# Patient Record
Sex: Female | Born: 1965 | Race: White | Hispanic: No | Marital: Married | Smoking: Never smoker
Health system: Southern US, Community
[De-identification: ages and names within clinical notes are randomized; demographics above are authoritative.]

## PROBLEM LIST (undated history)

## (undated) DIAGNOSIS — T7840XA Allergy, unspecified, initial encounter: Secondary | ICD-10-CM

## (undated) DIAGNOSIS — I2510 Atherosclerotic heart disease of native coronary artery without angina pectoris: Secondary | ICD-10-CM

## (undated) DIAGNOSIS — Z8619 Personal history of other infectious and parasitic diseases: Secondary | ICD-10-CM

## (undated) DIAGNOSIS — K219 Gastro-esophageal reflux disease without esophagitis: Secondary | ICD-10-CM

## (undated) DIAGNOSIS — K5790 Diverticulosis of intestine, part unspecified, without perforation or abscess without bleeding: Secondary | ICD-10-CM

## (undated) DIAGNOSIS — Z973 Presence of spectacles and contact lenses: Secondary | ICD-10-CM

## (undated) DIAGNOSIS — E785 Hyperlipidemia, unspecified: Secondary | ICD-10-CM

## (undated) HISTORY — PX: BLADDER SUSPENSION: SHX72

## (undated) HISTORY — DX: Presence of spectacles and contact lenses: Z97.3

## (undated) HISTORY — DX: Allergy, unspecified, initial encounter: T78.40XA

## (undated) HISTORY — DX: Personal history of other infectious and parasitic diseases: Z86.19

## (undated) HISTORY — DX: Hyperlipidemia, unspecified: E78.5

## (undated) HISTORY — DX: Atherosclerotic heart disease of native coronary artery without angina pectoris: I25.10

## (undated) HISTORY — DX: Gastro-esophageal reflux disease without esophagitis: K21.9

## (undated) HISTORY — DX: Diverticulosis of intestine, part unspecified, without perforation or abscess without bleeding: K57.90

## (undated) HISTORY — PX: CHOLECYSTECTOMY: SHX55

## (undated) HISTORY — PX: ABDOMINAL HYSTERECTOMY: SHX81

## (undated) HISTORY — PX: LAPAROSCOPIC ENDOMETRIOSIS FULGURATION: SUR769

---

## 1998-02-08 ENCOUNTER — Other Ambulatory Visit: Admission: RE | Admit: 1998-02-08 | Discharge: 1998-02-08 | Payer: Self-pay | Admitting: Obstetrics & Gynecology

## 1998-12-05 ENCOUNTER — Other Ambulatory Visit: Admission: RE | Admit: 1998-12-05 | Discharge: 1998-12-05 | Payer: Self-pay | Admitting: Obstetrics & Gynecology

## 1999-08-11 ENCOUNTER — Inpatient Hospital Stay (HOSPITAL_COMMUNITY): Admission: AD | Admit: 1999-08-11 | Discharge: 1999-08-13 | Payer: Self-pay | Admitting: Obstetrics & Gynecology

## 1999-08-23 ENCOUNTER — Encounter: Admission: RE | Admit: 1999-08-23 | Discharge: 1999-11-21 | Payer: Self-pay | Admitting: Obstetrics & Gynecology

## 1999-09-18 ENCOUNTER — Encounter: Payer: Self-pay | Admitting: Family Medicine

## 1999-09-18 ENCOUNTER — Ambulatory Visit (HOSPITAL_COMMUNITY): Admission: RE | Admit: 1999-09-18 | Discharge: 1999-09-18 | Payer: Self-pay | Admitting: Family Medicine

## 2000-01-01 ENCOUNTER — Encounter (INDEPENDENT_AMBULATORY_CARE_PROVIDER_SITE_OTHER): Payer: Self-pay

## 2000-01-01 ENCOUNTER — Observation Stay (HOSPITAL_COMMUNITY): Admission: RE | Admit: 2000-01-01 | Discharge: 2000-01-01 | Payer: Self-pay | Admitting: Surgery

## 2000-01-01 ENCOUNTER — Encounter: Payer: Self-pay | Admitting: Surgery

## 2000-02-19 ENCOUNTER — Other Ambulatory Visit: Admission: RE | Admit: 2000-02-19 | Discharge: 2000-02-19 | Payer: Self-pay | Admitting: Obstetrics & Gynecology

## 2001-03-04 ENCOUNTER — Encounter (INDEPENDENT_AMBULATORY_CARE_PROVIDER_SITE_OTHER): Payer: Self-pay | Admitting: Specialist

## 2001-03-04 ENCOUNTER — Other Ambulatory Visit: Admission: RE | Admit: 2001-03-04 | Discharge: 2001-03-04 | Payer: Self-pay | Admitting: *Deleted

## 2001-04-30 ENCOUNTER — Other Ambulatory Visit: Admission: RE | Admit: 2001-04-30 | Discharge: 2001-04-30 | Payer: Self-pay | Admitting: Obstetrics & Gynecology

## 2002-07-21 ENCOUNTER — Other Ambulatory Visit: Admission: RE | Admit: 2002-07-21 | Discharge: 2002-07-21 | Payer: Self-pay | Admitting: Obstetrics and Gynecology

## 2003-04-26 ENCOUNTER — Observation Stay (HOSPITAL_COMMUNITY): Admission: AD | Admit: 2003-04-26 | Discharge: 2003-04-27 | Payer: Self-pay | Admitting: Obstetrics & Gynecology

## 2003-05-17 ENCOUNTER — Inpatient Hospital Stay (HOSPITAL_COMMUNITY): Admission: AD | Admit: 2003-05-17 | Discharge: 2003-05-20 | Payer: Self-pay | Admitting: Obstetrics & Gynecology

## 2003-08-03 ENCOUNTER — Other Ambulatory Visit: Admission: RE | Admit: 2003-08-03 | Discharge: 2003-08-03 | Payer: Self-pay | Admitting: Obstetrics & Gynecology

## 2004-09-06 ENCOUNTER — Other Ambulatory Visit: Admission: RE | Admit: 2004-09-06 | Discharge: 2004-09-06 | Payer: Self-pay | Admitting: Obstetrics & Gynecology

## 2005-09-14 ENCOUNTER — Other Ambulatory Visit: Admission: RE | Admit: 2005-09-14 | Discharge: 2005-09-14 | Payer: Self-pay | Admitting: Obstetrics & Gynecology

## 2007-03-11 ENCOUNTER — Ambulatory Visit (HOSPITAL_COMMUNITY): Admission: RE | Admit: 2007-03-11 | Discharge: 2007-03-12 | Payer: Self-pay | Admitting: Obstetrics and Gynecology

## 2008-09-03 ENCOUNTER — Encounter: Admission: RE | Admit: 2008-09-03 | Discharge: 2008-09-03 | Payer: Self-pay | Admitting: Family Medicine

## 2008-11-03 DIAGNOSIS — J309 Allergic rhinitis, unspecified: Secondary | ICD-10-CM | POA: Insufficient documentation

## 2008-11-03 DIAGNOSIS — F329 Major depressive disorder, single episode, unspecified: Secondary | ICD-10-CM

## 2008-11-03 DIAGNOSIS — F4321 Adjustment disorder with depressed mood: Secondary | ICD-10-CM | POA: Insufficient documentation

## 2008-11-04 ENCOUNTER — Ambulatory Visit: Payer: Self-pay | Admitting: Internal Medicine

## 2008-11-04 DIAGNOSIS — R05 Cough: Secondary | ICD-10-CM

## 2008-11-04 DIAGNOSIS — R059 Cough, unspecified: Secondary | ICD-10-CM | POA: Insufficient documentation

## 2008-11-19 ENCOUNTER — Ambulatory Visit: Payer: Self-pay | Admitting: Internal Medicine

## 2008-11-19 DIAGNOSIS — R079 Chest pain, unspecified: Secondary | ICD-10-CM | POA: Insufficient documentation

## 2008-11-22 ENCOUNTER — Telehealth (INDEPENDENT_AMBULATORY_CARE_PROVIDER_SITE_OTHER): Payer: Self-pay | Admitting: *Deleted

## 2008-12-07 ENCOUNTER — Ambulatory Visit: Payer: Self-pay | Admitting: Internal Medicine

## 2008-12-07 ENCOUNTER — Encounter: Payer: Self-pay | Admitting: Adult Health

## 2009-01-13 ENCOUNTER — Ambulatory Visit: Payer: Self-pay | Admitting: Internal Medicine

## 2009-01-18 ENCOUNTER — Ambulatory Visit: Payer: Self-pay | Admitting: Cardiology

## 2009-02-23 ENCOUNTER — Ambulatory Visit: Payer: Self-pay | Admitting: Internal Medicine

## 2009-03-24 ENCOUNTER — Encounter: Payer: Self-pay | Admitting: Internal Medicine

## 2009-08-13 DIAGNOSIS — E8940 Asymptomatic postprocedural ovarian failure: Secondary | ICD-10-CM | POA: Insufficient documentation

## 2010-08-01 ENCOUNTER — Encounter (INDEPENDENT_AMBULATORY_CARE_PROVIDER_SITE_OTHER): Payer: Self-pay | Admitting: Obstetrics and Gynecology

## 2010-08-01 ENCOUNTER — Ambulatory Visit (HOSPITAL_COMMUNITY)
Admission: RE | Admit: 2010-08-01 | Discharge: 2010-08-02 | Payer: Self-pay | Source: Home / Self Care | Attending: Obstetrics and Gynecology | Admitting: Obstetrics and Gynecology

## 2010-10-23 LAB — COMPREHENSIVE METABOLIC PANEL
ALT: 14 U/L (ref 0–35)
Albumin: 3.8 g/dL (ref 3.5–5.2)
Alkaline Phosphatase: 57 U/L (ref 39–117)
Glucose, Bld: 87 mg/dL (ref 70–99)
Potassium: 3.7 mEq/L (ref 3.5–5.1)
Sodium: 136 mEq/L (ref 135–145)
Total Protein: 7.9 g/dL (ref 6.0–8.3)

## 2010-10-23 LAB — CBC
HCT: 42.3 % (ref 36.0–46.0)
Hemoglobin: 14.6 g/dL (ref 12.0–15.0)
MCH: 30.5 pg (ref 26.0–34.0)
MCH: 30.9 pg (ref 26.0–34.0)
MCHC: 34.5 g/dL (ref 30.0–36.0)
MCV: 89.6 fL (ref 78.0–100.0)
Platelets: 242 10*3/uL (ref 150–400)
Platelets: 337 10*3/uL (ref 150–400)
RBC: 3.48 MIL/uL — ABNORMAL LOW (ref 3.87–5.11)
RBC: 4.72 MIL/uL (ref 3.87–5.11)
RDW: 12.8 % (ref 11.5–15.5)
RDW: 13.3 % (ref 11.5–15.5)
WBC: 7.9 10*3/uL (ref 4.0–10.5)
WBC: 9.4 10*3/uL (ref 4.0–10.5)

## 2010-10-23 LAB — BASIC METABOLIC PANEL
CO2: 27 mEq/L (ref 19–32)
Calcium: 8.4 mg/dL (ref 8.4–10.5)
Creatinine, Ser: 0.92 mg/dL (ref 0.4–1.2)
GFR calc Af Amer: >60 mL/min (ref >60)

## 2010-10-23 LAB — SURGICAL PCR SCREEN
MRSA, PCR: NEGATIVE
Staphylococcus aureus: NEGATIVE

## 2010-10-23 LAB — URINE MICROSCOPIC-ADD ON

## 2010-10-23 LAB — PREGNANCY, URINE: Preg Test, Ur: NEGATIVE

## 2010-10-23 LAB — URINALYSIS, ROUTINE W REFLEX MICROSCOPIC
Glucose, UA: NEGATIVE mg/dL
Specific Gravity, Urine: >1.03 — ABNORMAL HIGH (ref 1.005–1.030)

## 2010-12-26 NOTE — Op Note (Signed)
NAMEGARY, BULTMAN NO.:  [id number]   MEDICAL RECORD NO.:  [id number]          PATIENT TYPE:  AMB   LOCATION:  SDC                           FACILITY:  [facility]   PHYSICIAN:  Randye Lobo, M.D.   DATE OF BIRTH:  25-Jun-1966   DATE OF PROCEDURE:  03/11/2007  DATE OF DISCHARGE:                               OPERATIVE REPORT   PREOPERATIVE DIAGNOSIS:  1. Genuine stress incontinence.  2. Incomplete vaginal prolapse.   POSTOPERATIVE DIAGNOSIS:  1. Genuine stress incontinence.  2. Incomplete vaginal prolapse.   PROCEDURE:  Monarch transobturator sling, cystoscopy, anterior  colporrhaphy.   SURGEON:  Conley Simmonds, M.D.   ASSISTANT:  Gerrit Friends. Aldona Bar, M.D.   ANESTHESIA:  LMA, local 1% lidocaine with epinephrine 1:200,000.   IV FLUIDS:  1100 mL Ringer's lactate.   ESTIMATED BLOOD LOSS:  50 mL   URINE OUTPUT:  Quantity sufficient.   COMPLICATIONS:  None.   INDICATIONS FOR PROCEDURE:  The patient is a 45 year old para 2  Caucasian female who presented to her primary gynecologist, Dr. Annamaria Helling, with the report of urinary incontinence.  The patient was  experiencing incontinence with coughing, sneezing, laughing and exercise  which required pad use.  The patient was also reporting significant  urgency with urge incontinence.  The patient underwent multichannel  urodynamic studies on December 02, 2006 which documented mixed  incontinence.  The patient did have a Valsalva leak point pressure of 78  cm of water.  The patient began on Detrol LA for her urge incontinence.  The patient desired treatment for her stress incontinence and also  repair of the first-degree cystocele which was noted on pelvic exam.  A  plan is now made to proceed with a Monarch transobturator sling,  cystoscopy, and anterior colporrhaphy after risks, benefits, and  alternatives are reviewed.   FINDINGS:  Examination under anesthesia revealed a first-degree  cystocele and first-degree  uterine prolapse.  There was good posterior  vaginal wall support.   Cystoscopy was performed during placement of the sling procedure.  There  is no evidence of foreign body in the bladder or the urethra.  The  bladder was visualized throughout 360 degrees including the bladder dome  and trigone.  Initially, only the right ureter demonstrated patency  after injection of indigo carmine dye.  There was some delay on the left-  hand side.  The suture had been placed over some bleeding vessels on the  bladder during the course of the dissection for the anterior  colporrhaphy.  When there was a delay in demonstration of indigo carmine  from the left ureter, a decision was made to remove this suture, which  actually had been placed very superficially.  There was then evidence of  indigo carmine dye coming from the left ureter after this suture was  removed.   PROCEDURE:  The patient was reidentified in the preoperative hold area.  She received Ciprofloxacin 400 mg IV for antibiotic prophylaxis.  She  received both TED hose and PAS stockings for DVT prophylaxis.   In the operating room, the patient received  an LMA anesthetic.  She was  placed in the dorsal lithotomy position.  The vagina and the lower  abdomen were sterilely prepped and draped.  A Foley catheter was placed  in the bladder.   An exam under anesthesia was performed.  A weighted speculum was placed  inside the vagina.  Allis clamps were placed along the anterior vaginal  wall.  The vaginal mucosa was injected with half percent lidocaine with  1:200,000 of epinephrine.  The anterior vaginal wall mucosa was then  incised vertically with a scalpel from the level of just above the  cervix to 1 cm below the urethral meatus.  A combination of sharp and  blunt dissection were used to dissect the vaginal mucosa away from the  underlying subvaginal tissue and bladder.  The dissection was carried  back to the level of the pubic rami  cranially and down to the level of  the cervix caudally.  Hemostasis during the procedure was performed with  a combination of monopolar cautery.  In the midline underneath the  urethral area.  A figure-of-eight suture of 2-0 Vicryl was placed for  hemostasis.  Similarly two sutures of 0 Vicryl were placed in a figure-  of-eight fashion over the bleeding vessel on the bladder on the  patient's left-hand side.   The Monarch sling was next placed.  The adductor longus tendons were  identified in the thighs bilaterally.  Crural incisions were then  created bilaterally approximately 2 cm below this and lateral to the  pubic rami.  These measured each approximately 0.75 cm.  The Monarch was  placed first on the patient's left-hand side.  The needle passer was  brought through the skin, through the obturator internus muscle and  membrane and out through the vagina.  This was performed without  difficulty.  The same procedure that was performed on the left-hand side  was then repeated on the right-hand side.   A Foley catheter was removed and cystoscopy was performed and the  findings are as noted above.  The cystoscope was removed and the bladder  was drained and the Foley catheter was replaced.  The sling was then  connected to the needle passers and the sling was adjusted under the mid  urethra.  While a Kelly clamp was placed between the sling and the  urethra.  The plastic sheaths were removed.  Excess sling material was  then trimmed.  The Foley catheter was removed and again, cystoscopy was  performed.  Again there was no evidence of patency of the left ureter.  The bladder was therefore drained of the cystoscopy fluid and one of the  sutures on the left side of the bladder was then cut.  Hemostasis was  still good in this area.  Cystoscopy was performed again and now there  was patency of the left ureter.  The bladder was again drained of  cystoscopy fluid and the Foley catheter was  replaced.   The anterior colporrhaphy was performed with vertical mattress sutures  of 2-0 Vicryl.  Excess vaginal mucosa was trimmed and the anterior  vaginal wall was then closed with a running locked suture of 2-0 Vicryl.  Hemostasis was good.   The suprapubic incisions were closed with Dermabond.  The Foley catheter  was left to gravity drainage.  The patient was cleansed of Betadine.  This concluded the patient's procedure.  There were no complications.  All needle, instrument, and sponge counts were correct.  The patient is  escorted to the recovery room in stable and awake condition.      Randye Lobo, M.D.  Electronically Signed     BES/MEDQ  D:  03/11/2007  T:  03/11/2007  Job:  [id number]

## 2010-12-29 NOTE — Op Note (Signed)
[place]  Patient:    [name], [name]                     MRN: [id number] Proc. Date: 01/01/00 Adm. Date:  [id number] Disc. Date: [id number] Attending:  Andre Lefort CC:         Elana Alm. Eliezer Lofts., M.D.             Gerrit Friends. Aldona Bar, M.D.                           Operative Report  CCS# [id number]  DATE OF BIRTH:  20-Jul-1966  PREOPERATIVE DIAGNOSIS:  Chronic cholecystitis with cholelithiasis.  POSTOPERATIVE DIAGNOSIS:  Chronic cholecystitis with cholelithiasis.  OPERATION PERFORMED:  Laparoscopic cholecystectomy with intraoperative cholangiogram.  SURGEON:  Sandria Bales. Ezzard Standing, M.D.  ASSISTANT:  Angelia Mould. Derrell Lolling, M.D.  ANESTHESIA:  General endotracheal.  ESTIMATED BLOOD LOSS:  Minimal.  INDICATIONS FOR PROCEDURE:  The patient is a 45 year old white female who presents with symptomatic cholelithiasis.  She now comes for laparoscopic cholecystectomy.  The indications and complications of the procedure had been explained to the patient.  DESCRIPTION OF PROCEDURE:  The patient was placed in the supine position, given a general endotracheal anesthetic.  She had 1 gm of Ancef at the initiation of the procedure, PAS stockings in place and orogastric tube in place.  Her abdomen was prepped with Betadine solution and sterilely draped.  An infraumbilical incision was made with sharp dissection carried down to the abdominal cavity.  A 0 degree laparoscope was inserted through a 12 mm Hasson trocar and this was secured with a 0 Vicryl suture.  Laparoscopic exploration revealed both lobes of the liver unremarkable.  Anterior wall of the stomach unremarkable.  The bowel was unremarkable.  There was no evidence of hernia. The bladder was fairly full.  Three additional trocars were placed.  A 10 mm Ethicon trocar in the subxiphoid location and two 5 mm Ethicon trocars in the right midsubcostal, right lateral subcostal locations.  Each trocar  site had been infiltrated with 0.25% Marcaine prior to insertion.  The gallbladder was grasped, rotated cephalad.  There were noted to be adhesions about halfway up the gallbladder wall with part of the duodenum adhesed to the gallbladder consistent with chronic cholecystitis.  These adhesions were taken down in a blunt fashion.  The cystic duct was then isolated.  An intraoperative cholangiogram was obtained using a cut off taut catheter inserted through a Jelco catheter into the abdominal cavity.  The cystic duct was cut and milked back.  There was no stone or debris coming back.  The catheter was then placed in the cystic duct and clamped and intraoperative cholangiogram under fluoroscopy using about 10 cc of half-strength Hypaque solution was then shot.  This showed free flow of contrast down the cystic duct into the common bile duct.  The common bile duct was fairly short but had prompt flow into the duodenum.  There was no obstruction and no mass and had free flow back into the hepatic radicals.  This was felt to be a normal intraoperative cholangiogram and the taut catheter was then removed.  The cystic duct was then triply endoclipped and divided.  There was no one discrete cystic artery.  There were multiple tiny branches with I either clipped or bovied.  The gallbladder was then bluntly and sharply dissected from the gallbladder bed using primarily  hook Bovie coagulation. Prior to complete division of the gallbladder from the gallbladder bed, the gallbladder bed itself was visualized.  There was no bile leak, no bleeding and the triangle of Calot was visualized.  There was no bile leak or bleeding.  The gallbladder was then divided and delivered through the umbilicus intact and sent to pathology.  Each trocar was then visualized.  There was no bleeding at any trocar site.  The umbilical trocar was closed with a 0 Vicryl suture.  The other trocars were then removed.  The skin  closed at each site with a 5-0 Vicryl suture, painted with tincture of benzoin, steri-stripped and sterilely dressed.  The patient tolerated the procedure well, was transported to the recovery room in good condition.  The sponge and needle counts were correct at the end of the case. DD:  01/01/00 TD:  01/04/00 Job: [id number]

## 2010-12-29 NOTE — Discharge Summary (Signed)
   NAME:  [name], [name]                        ACCOUNT NO.:  [id number]   MEDICAL RECORD NO.:  [id number]                   PATIENT TYPE:  INP   LOCATION:  9160                                 FACILITY:  [facility]   PHYSICIAN:  Miguel Aschoff, M.D.                    DATE OF BIRTH:  Aug 07, 1966   DATE OF ADMISSION:  04/26/2003  DATE OF DISCHARGE:  04/27/2003                                 DISCHARGE SUMMARY   ADMISSION DIAGNOSES:  1. Intrauterine pregnancy at 36 weeks.  2. Viral syndrome.   FINAL DIAGNOSES:  1. Intrauterine pregnancy at 36 weeks.  2. Viral syndrome.   BRIEF HISTORY:  The patient is a 45 year old white female gravida 2, para 1-  0-0-1 at 36 weeks who presented to the office with nausea and vomiting not  responsive to outpatient therapy.  Because of her symptomatology, it was  elected to admit the patient for IV hydration and observation.  Additionally, since the patient did not have a Strep culture done she was  started on clindamycin therapy ____ mg IV q.8h.  The patient responded very  readily to the intravenous fluids.  Her fetal heart tracing remained  reactive.  She was noted to be having rare contractions.  However, by the  afternoon of September 14 she was in satisfactory condition and felt stable  enough to be sent home.  She was sent home on Zofran 8 mg early dissolving  tablets, Imodium as needed for diarrhea.  She is instructed to remain on  clear liquids until she felt better, to return to the office in one week for  a follow-up examination.                                               Miguel Aschoff, M.D.    AR/MEDQ  D:  06/16/2003  T:  06/17/2003  Job:  [id number]

## 2011-03-13 ENCOUNTER — Other Ambulatory Visit: Payer: Self-pay | Admitting: Family Medicine

## 2011-03-13 DIAGNOSIS — M25562 Pain in left knee: Secondary | ICD-10-CM

## 2011-03-14 ENCOUNTER — Ambulatory Visit
Admission: RE | Admit: 2011-03-14 | Discharge: 2011-03-14 | Disposition: A | Discharge status: Home / Self Care | Payer: BLUE CROSS/BLUE SHIELD | Source: Ambulatory Visit | Attending: Family Medicine | Admitting: Family Medicine

## 2011-03-14 DIAGNOSIS — M25562 Pain in left knee: Secondary | ICD-10-CM

## 2011-05-28 LAB — CBC
MCHC: 34.6
MCV: 93
Platelets: 267
RBC: 4.73
RDW: 12.6
WBC: 6

## 2011-05-28 LAB — BASIC METABOLIC PANEL
Chloride: 103
Creatinine, Ser: 0.91
GFR calc Af Amer: >60
GFR calc non Af Amer: >60

## 2011-05-28 LAB — URINALYSIS, ROUTINE W REFLEX MICROSCOPIC
Glucose, UA: NEGATIVE
Ketones, ur: NEGATIVE
Leukocytes, UA: NEGATIVE
Nitrite: NEGATIVE
Specific Gravity, Urine: >1.03 — ABNORMAL HIGH
pH: 6

## 2011-05-28 LAB — URINE MICROSCOPIC-ADD ON

## 2011-05-28 LAB — PREGNANCY, URINE: Preg Test, Ur: NEGATIVE

## 2014-08-09 ENCOUNTER — Other Ambulatory Visit: Payer: Self-pay | Admitting: Family Medicine

## 2014-08-09 DIAGNOSIS — E049 Nontoxic goiter, unspecified: Secondary | ICD-10-CM

## 2014-08-11 ENCOUNTER — Ambulatory Visit
Admission: RE | Admit: 2014-08-11 | Discharge: 2014-08-11 | Disposition: A | Discharge status: Home / Self Care | Payer: BC Managed Care – PPO | Source: Ambulatory Visit | Attending: Family Medicine | Admitting: Family Medicine

## 2014-08-11 DIAGNOSIS — E049 Nontoxic goiter, unspecified: Secondary | ICD-10-CM

## 2014-08-12 ENCOUNTER — Other Ambulatory Visit: Payer: Self-pay | Admitting: Family Medicine

## 2014-08-12 DIAGNOSIS — R1310 Dysphagia, unspecified: Secondary | ICD-10-CM

## 2014-08-31 ENCOUNTER — Ambulatory Visit
Admission: RE | Admit: 2014-08-31 | Discharge: 2014-08-31 | Disposition: A | Discharge status: Home / Self Care | Payer: Self-pay | Source: Ambulatory Visit | Attending: Family Medicine | Admitting: Family Medicine

## 2014-08-31 DIAGNOSIS — R1310 Dysphagia, unspecified: Secondary | ICD-10-CM

## 2016-07-25 ENCOUNTER — Other Ambulatory Visit: Payer: Self-pay | Admitting: Family Medicine

## 2016-07-25 ENCOUNTER — Other Ambulatory Visit: Payer: BLUE CROSS/BLUE SHIELD

## 2016-07-25 ENCOUNTER — Ambulatory Visit
Admission: RE | Admit: 2016-07-25 | Discharge: 2016-07-25 | Disposition: A | Discharge status: Home / Self Care | Payer: BLUE CROSS/BLUE SHIELD | Source: Ambulatory Visit | Attending: Family Medicine | Admitting: Family Medicine

## 2016-07-25 DIAGNOSIS — R10A0 Flank pain, unspecified side: Secondary | ICD-10-CM

## 2016-07-25 DIAGNOSIS — R109 Unspecified abdominal pain: Secondary | ICD-10-CM

## 2016-07-27 ENCOUNTER — Other Ambulatory Visit: Payer: Self-pay | Admitting: Family Medicine

## 2016-07-27 DIAGNOSIS — R160 Hepatomegaly, not elsewhere classified: Secondary | ICD-10-CM

## 2016-08-04 ENCOUNTER — Ambulatory Visit
Admission: RE | Admit: 2016-08-04 | Discharge: 2016-08-04 | Disposition: A | Discharge status: Home / Self Care | Payer: BLUE CROSS/BLUE SHIELD | Source: Ambulatory Visit | Attending: Family Medicine | Admitting: Family Medicine

## 2016-08-04 ENCOUNTER — Other Ambulatory Visit: Payer: Self-pay | Admitting: Family Medicine

## 2016-08-04 DIAGNOSIS — R160 Hepatomegaly, not elsewhere classified: Secondary | ICD-10-CM

## 2016-08-17 ENCOUNTER — Other Ambulatory Visit: Payer: BLUE CROSS/BLUE SHIELD

## 2016-11-21 ENCOUNTER — Ambulatory Visit
Admission: RE | Admit: 2016-11-21 | Discharge: 2016-11-21 | Disposition: A | Discharge status: Home / Self Care | Payer: BLUE CROSS/BLUE SHIELD | Source: Ambulatory Visit | Attending: Family Medicine | Admitting: Family Medicine

## 2016-11-21 ENCOUNTER — Other Ambulatory Visit: Payer: Self-pay | Admitting: Family Medicine

## 2016-11-21 DIAGNOSIS — R160 Hepatomegaly, not elsewhere classified: Secondary | ICD-10-CM

## 2016-11-21 MED ORDER — GADOBENATE DIMEGLUMINE 529 MG/ML IV SOLN
7.0000 mL | Freq: Once | INTRAVENOUS | Status: AC | PRN
  Administered 2016-11-21: 7 mL via INTRAVENOUS

## 2017-07-31 ENCOUNTER — Other Ambulatory Visit: Payer: Self-pay | Admitting: Obstetrics & Gynecology

## 2017-07-31 DIAGNOSIS — R928 Other abnormal and inconclusive findings on diagnostic imaging of breast: Secondary | ICD-10-CM

## 2017-08-05 ENCOUNTER — Ambulatory Visit
Admission: RE | Admit: 2017-08-05 | Discharge: 2017-08-05 | Disposition: A | Discharge status: Home / Self Care | Payer: BLUE CROSS/BLUE SHIELD | Source: Ambulatory Visit | Attending: Obstetrics & Gynecology | Admitting: Obstetrics & Gynecology

## 2017-08-05 ENCOUNTER — Other Ambulatory Visit: Payer: Self-pay | Admitting: Obstetrics & Gynecology

## 2017-08-05 DIAGNOSIS — R928 Other abnormal and inconclusive findings on diagnostic imaging of breast: Secondary | ICD-10-CM

## 2017-08-05 DIAGNOSIS — N6310 Unspecified lump in the right breast, unspecified quadrant: Secondary | ICD-10-CM

## 2018-02-03 ENCOUNTER — Ambulatory Visit
Admission: RE | Admit: 2018-02-03 | Discharge: 2018-02-03 | Disposition: A | Discharge status: Home / Self Care | Payer: Self-pay | Source: Ambulatory Visit | Attending: Obstetrics & Gynecology | Admitting: Obstetrics & Gynecology

## 2018-02-03 ENCOUNTER — Other Ambulatory Visit: Payer: Self-pay | Admitting: Obstetrics & Gynecology

## 2018-02-03 DIAGNOSIS — N63 Unspecified lump in unspecified breast: Secondary | ICD-10-CM

## 2018-02-03 DIAGNOSIS — N6310 Unspecified lump in the right breast, unspecified quadrant: Secondary | ICD-10-CM

## 2018-07-17 DIAGNOSIS — S6992XA Unspecified injury of left wrist, hand and finger(s), initial encounter: Secondary | ICD-10-CM | POA: Diagnosis present

## 2018-07-17 DIAGNOSIS — Y929 Unspecified place or not applicable: Secondary | ICD-10-CM | POA: Diagnosis not present

## 2018-07-17 DIAGNOSIS — T23222A Burn of second degree of single left finger (nail) except thumb, initial encounter: Secondary | ICD-10-CM | POA: Insufficient documentation

## 2018-07-17 DIAGNOSIS — Y999 Unspecified external cause status: Secondary | ICD-10-CM | POA: Insufficient documentation

## 2018-07-17 DIAGNOSIS — X19XXXA Contact with other heat and hot substances, initial encounter: Secondary | ICD-10-CM | POA: Diagnosis not present

## 2018-07-17 DIAGNOSIS — Y939 Activity, unspecified: Secondary | ICD-10-CM | POA: Insufficient documentation

## 2018-07-18 ENCOUNTER — Emergency Department (HOSPITAL_COMMUNITY)
Admission: EM | Admit: 2018-07-18 | Discharge: 2018-07-18 | Disposition: A | Discharge status: Home / Self Care | Payer: BLUE CROSS/BLUE SHIELD | Attending: Emergency Medicine | Admitting: Emergency Medicine

## 2018-07-18 ENCOUNTER — Other Ambulatory Visit: Payer: Self-pay

## 2018-07-18 DIAGNOSIS — T23222A Burn of second degree of single left finger (nail) except thumb, initial encounter: Secondary | ICD-10-CM

## 2018-07-18 MED ORDER — LIDOCAINE HCL (PF) 1 % IJ SOLN
5.0000 mL | Freq: Once | INTRAMUSCULAR | Status: AC
  Administered 2018-07-18: 5 mL via INTRADERMAL
  Filled 2018-07-18: qty 5
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
## 2018-07-18 NOTE — Discharge Instructions (Addendum)
1. Medications: Silvadene - prescription at home, usual home medications 2. Treatment: rest, drink plenty of fluids, keep wound clean with warm soap and water, keep bandages dry 3. Follow Up: Please followup with your primary doctor in 3-5 days for discussion of your diagnoses and further evaluation after today's visit; if you do not have a primary care doctor use the resource guide provided to find one; Please return to the ER for infection including redness, increased pain, purulent drainage

## 2018-07-31 ENCOUNTER — Encounter: Payer: Self-pay | Admitting: Internal Medicine

## 2018-08-07 ENCOUNTER — Other Ambulatory Visit: Payer: BLUE CROSS/BLUE SHIELD

## 2018-09-22 ENCOUNTER — Emergency Department (HOSPITAL_BASED_OUTPATIENT_CLINIC_OR_DEPARTMENT_OTHER)
Admission: EM | Admit: 2018-09-22 | Discharge: 2018-09-22 | Disposition: A | Discharge status: Home / Self Care | Payer: BLUE CROSS/BLUE SHIELD | Attending: Emergency Medicine | Admitting: Emergency Medicine

## 2018-09-22 ENCOUNTER — Encounter (HOSPITAL_BASED_OUTPATIENT_CLINIC_OR_DEPARTMENT_OTHER): Payer: Self-pay | Admitting: *Deleted

## 2018-09-22 ENCOUNTER — Other Ambulatory Visit: Payer: Self-pay

## 2018-09-22 ENCOUNTER — Emergency Department (HOSPITAL_BASED_OUTPATIENT_CLINIC_OR_DEPARTMENT_OTHER): Payer: BLUE CROSS/BLUE SHIELD

## 2018-09-22 DIAGNOSIS — K5792 Diverticulitis of intestine, part unspecified, without perforation or abscess without bleeding: Secondary | ICD-10-CM | POA: Insufficient documentation

## 2018-09-22 DIAGNOSIS — R1030 Lower abdominal pain, unspecified: Secondary | ICD-10-CM | POA: Diagnosis present

## 2018-09-22 DIAGNOSIS — R109 Unspecified abdominal pain: Secondary | ICD-10-CM

## 2018-09-22 LAB — URINALYSIS, ROUTINE W REFLEX MICROSCOPIC
Bilirubin Urine: NEGATIVE
Glucose, UA: NEGATIVE mg/dL
KETONES UR: NEGATIVE mg/dL
Leukocytes, UA: NEGATIVE
Nitrite: NEGATIVE
Protein, ur: NEGATIVE mg/dL
Specific Gravity, Urine: <1.005 — ABNORMAL LOW (ref 1.005–1.030)
pH: 6 (ref 5.0–8.0)

## 2018-09-22 LAB — COMPREHENSIVE METABOLIC PANEL
ALK PHOS: 68 U/L (ref 38–126)
ALT: 17 U/L (ref 0–44)
AST: 19 U/L (ref 15–41)
Albumin: 4.4 g/dL (ref 3.5–5.0)
Anion gap: 7 (ref 5–15)
BILIRUBIN TOTAL: 1.4 mg/dL — AB (ref 0.3–1.2)
BUN: 15 mg/dL (ref 6–20)
CALCIUM: 9.6 mg/dL (ref 8.9–10.3)
CO2: 27 mmol/L (ref 22–32)
CREATININE: 0.84 mg/dL (ref 0.44–1.00)
Chloride: 103 mmol/L (ref 98–111)
GFR calc non Af Amer: >60 mL/min (ref >60)
GLUCOSE: 90 mg/dL (ref 70–99)
Potassium: 3.5 mmol/L (ref 3.5–5.1)
Sodium: 137 mmol/L (ref 135–145)
Total Protein: 8.1 g/dL (ref 6.5–8.1)

## 2018-09-22 LAB — CBC
HCT: 46 % (ref 36.0–46.0)
Hemoglobin: 15.1 g/dL — ABNORMAL HIGH (ref 12.0–15.0)
MCH: 31 pg (ref 26.0–34.0)
MCHC: 32.8 g/dL (ref 30.0–36.0)
MCV: 94.5 fL (ref 80.0–100.0)
NRBC: 0 % (ref 0.0–0.2)
PLATELETS: 346 10*3/uL (ref 150–400)
RBC: 4.87 MIL/uL (ref 3.87–5.11)
RDW: 12.8 % (ref 11.5–15.5)
WBC: 10.5 10*3/uL (ref 4.0–10.5)

## 2018-09-22 LAB — URINALYSIS, MICROSCOPIC (REFLEX)

## 2018-09-22 LAB — LIPASE, BLOOD: Lipase: 29 U/L (ref 11–51)

## 2018-09-22 MED ORDER — ONDANSETRON HCL 4 MG/2ML IJ SOLN
4.0000 mg | Freq: Once | INTRAMUSCULAR | Status: AC
  Administered 2018-09-22: 4 mg via INTRAVENOUS
  Filled 2018-09-22: qty 2

## 2018-09-22 MED ORDER — CEPHALEXIN 500 MG PO CAPS: 500.0000 mg | ORAL_CAPSULE | Freq: Three times a day (TID) | ORAL | 0 refills | Status: AC

## 2018-09-22 MED ORDER — SODIUM CHLORIDE 0.9 % IV BOLUS
500.0000 mL | Freq: Once | INTRAVENOUS | Status: AC
  Administered 2018-09-22: 500 mL via INTRAVENOUS

## 2018-09-22 MED ORDER — IOPAMIDOL (ISOVUE-300) INJECTION 61%
100.0000 mL | Freq: Once | INTRAVENOUS | Status: AC | PRN
  Administered 2018-09-22: 100 mL via INTRAVENOUS

## 2018-09-22 MED ORDER — METRONIDAZOLE 500 MG PO TABS: 500.0000 mg | ORAL_TABLET | Freq: Three times a day (TID) | ORAL | 0 refills | Status: AC

## 2018-09-22 MED ORDER — SODIUM CHLORIDE 0.9% FLUSH
3.0000 mL | Freq: Once | INTRAVENOUS | Status: DC
  Filled 2018-09-22: qty 3

## 2018-09-22 MED ORDER — ONDANSETRON 4 MG PO TBDP: 4.0000 mg | ORAL_TABLET | Freq: Three times a day (TID) | ORAL | 0 refills | Status: DC | PRN

## 2018-09-22 MED ORDER — TRAMADOL HCL 50 MG PO TABS: 50.0000 mg | ORAL_TABLET | Freq: Four times a day (QID) | ORAL | 0 refills | Status: DC | PRN

## 2018-09-22 MED ORDER — FENTANYL CITRATE (PF) 100 MCG/2ML IJ SOLN
50.0000 ug | Freq: Once | INTRAMUSCULAR | Status: AC
  Administered 2018-09-22: 50 ug via INTRAVENOUS
  Filled 2018-09-22: qty 2

## 2018-09-22 NOTE — ED Provider Notes (Signed)
[facility] EMERGENCY DEPARTMENT Provider Note   CSN: [id number] Arrival date & time: 09/22/18  1259     History   Chief Complaint Chief Complaint  Patient presents with  . Abdominal Pain    HPI [name] is a 53 y.o. female.  HPI Patient presents with has had some mild pains with it over lower abdominal pain.  The last month.  However much more severe today.  Cramping.  States it was too severe to go to work.  No fevers or chills.  Has had nausea.  No diarrhea or constipation.  States she feels as if she has been urinating little more frequency.  States her abdomen is been feeling larger also.  Has had a previous total hysterectomy.   Patient Active Problem List   Diagnosis Date Noted  . CHEST PAIN 11/19/2008  . COUGH 11/04/2008  . DEPRESSION 11/03/2008  . ALLERGIC RHINITIS 11/03/2008    Past Surgical History:  Procedure Laterality Date  . ABDOMINAL HYSTERECTOMY    . BLADDER SUSPENSION    . CHOLECYSTECTOMY       OB History   No obstetric history on file.      Home Medications    Prior to Admission medications   Medication Sig Start Date End Date Taking? Authorizing Provider  ESTRADIOL PO Take by mouth.   Yes [provider]    Family History No family history on file.  Social History Social History   Tobacco Use  . Smoking status: Never Smoker  . Smokeless tobacco: Never Used  Substance Use Topics  . Alcohol use: Not Currently  . Drug use: Never     Allergies   Ciprofloxacin; Penicillins; and Sulfonamide derivatives   Review of Systems Review of Systems  Constitutional: Positive for appetite change.  HENT: Negative for congestion.   Respiratory: Negative for shortness of breath.   Gastrointestinal: Positive for abdominal distention, abdominal pain and nausea. Negative for constipation and vomiting.  Genitourinary: Positive for frequency. Negative for flank pain.  Musculoskeletal: Negative for back pain.  Skin:  Negative for rash.  Neurological: Negative for weakness.  Psychiatric/Behavioral: Negative for confusion.     Physical Exam Updated Vital Signs BP 132/90 (BP Location: Left Arm)   Pulse (!) 110   Temp 97.9 F (36.6 C) (Oral)   Resp 18   Ht 5\' 9"  (1.753 m)   Wt 74.4 kg   SpO2 100%   BMI 24.22 kg/m   Physical Exam HENT:     Head: Normocephalic.  Cardiovascular:     Rate and Rhythm: Regular rhythm.  Pulmonary:     Breath sounds: No wheezing, rhonchi or rales.  Abdominal:     Hernia: No hernia is present.     Comments: Lower abdominal tenderness without hernia or mass.  No CVA tenderness.  Skin:    General: Skin is warm.  Neurological:     Mental Status: She is alert and oriented to person, place, and time.      ED Treatments / Results  Labs (all labs ordered are listed, but only abnormal results are displayed) Labs Reviewed  COMPREHENSIVE METABOLIC PANEL - Abnormal; Notable for the following components:      Result Value   Total Bilirubin 1.4 (*)    All other components within normal limits  CBC - Abnormal; Notable for the following components:   Hemoglobin 15.1 (*)    All other components within normal limits  URINALYSIS, ROUTINE W REFLEX MICROSCOPIC - Abnormal; Notable for  the following components:   Color, Urine STRAW (*)    Specific Gravity, Urine <1.005 (*)    Hgb urine dipstick TRACE (*)    All other components within normal limits  URINALYSIS, MICROSCOPIC (REFLEX) - Abnormal; Notable for the following components:   Bacteria, UA FEW (*)    All other components within normal limits  LIPASE, BLOOD    EKG None  Radiology No results found.  Procedures Procedures (including critical care time)  Medications Ordered in ED Medications  sodium chloride flush (NS) 0.9 % injection 3 mL (3 mLs Intravenous Not Given 09/22/18 1321)  iopamidol (ISOVUE-300) 61 % injection 100 mL (has no administration in time range)  sodium chloride 0.9 % bolus 500 mL (500  mLs Intravenous New Bag/Given 09/22/18 1323)  ondansetron (ZOFRAN) injection 4 mg (4 mg Intravenous Given 09/22/18 1326)  fentaNYL (SUBLIMAZE) injection 50 mcg (50 mcg Intravenous Given 09/22/18 1325)     Initial Impression / Assessment and Plan / ED Course  I have reviewed the triage vital signs and the nursing notes.  Pertinent labs & imaging results that were available during my care of the patient were reviewed by me and considered in my medical decision making (see chart for details).     Patient with lower abdominal pain.  Lab work overall reassuring.  CT scan pending.  Care turned over to Dr. Billy Fischer.  Final Clinical Impressions(s) / ED Diagnoses   Final diagnoses:  None    ED Discharge Orders    None       Davonna Belling, MD 09/22/18 [id number]

## 2018-09-22 NOTE — ED Provider Notes (Signed)
  Physical Exam  BP (!) 130/95 (BP Location: Right Arm)   Pulse 100   Temp 97.9 F (36.6 C) (Oral)   Resp 14   Ht 5\' 9"  (1.753 m)   Wt 74.4 kg   SpO2 100%   BMI 24.22 kg/m   Physical Exam  ED Course/Procedures     Procedures  MDM  Received care form Dr. Alvino Chapel.  CT shows uncomplicated diverticulitis.  Discussed care. Given prescription for abx-keflex and flagyl. (allergic to cipro, penicillins).  Reviewed in [place] drug database with no prescriptions. Given rx for tramadol, zofran, recommend ibuprofen and tylenol. Patient discharged in stable condition with understanding of reasons to return.      Gareth Morgan, MD 09/23/18 1152

## 2018-09-22 NOTE — ED Triage Notes (Signed)
Lower abdominal pain x 1 month. Pain is stabbing. She is guarding her abdomen. She looks bloated.

## 2018-09-22 NOTE — ED Notes (Signed)
Pt states she still has pain and pressure in her abdomen. Pt to RR unassisted

## 2018-11-13 ENCOUNTER — Other Ambulatory Visit: Payer: BLUE CROSS/BLUE SHIELD

## 2019-02-02 ENCOUNTER — Ambulatory Visit
Admission: RE | Admit: 2019-02-02 | Discharge: 2019-02-02 | Disposition: A | Discharge status: Home / Self Care | Payer: PRIVATE HEALTH INSURANCE | Source: Ambulatory Visit | Attending: Obstetrics & Gynecology | Admitting: Obstetrics & Gynecology

## 2019-02-02 ENCOUNTER — Other Ambulatory Visit: Payer: Self-pay

## 2019-02-02 DIAGNOSIS — N63 Unspecified lump in unspecified breast: Secondary | ICD-10-CM

## 2019-07-07 ENCOUNTER — Other Ambulatory Visit: Payer: Self-pay

## 2019-07-07 ENCOUNTER — Encounter: Payer: Self-pay | Admitting: Physician Assistant

## 2019-07-07 ENCOUNTER — Ambulatory Visit (INDEPENDENT_AMBULATORY_CARE_PROVIDER_SITE_OTHER): Payer: BC Managed Care – PPO | Admitting: Physician Assistant

## 2019-07-07 VITALS — BP 110/78 | HR 97 | Temp 98.6°F | Resp 16 | Ht 68.0 in | Wt 173.0 lb

## 2019-07-07 DIAGNOSIS — R0789 Other chest pain: Secondary | ICD-10-CM

## 2019-07-07 DIAGNOSIS — E8940 Asymptomatic postprocedural ovarian failure: Secondary | ICD-10-CM

## 2019-07-07 DIAGNOSIS — R5382 Chronic fatigue, unspecified: Secondary | ICD-10-CM | POA: Diagnosis not present

## 2019-07-07 DIAGNOSIS — E7800 Pure hypercholesterolemia, unspecified: Secondary | ICD-10-CM

## 2019-07-07 DIAGNOSIS — F329 Major depressive disorder, single episode, unspecified: Secondary | ICD-10-CM | POA: Insufficient documentation

## 2019-07-07 DIAGNOSIS — B009 Herpesviral infection, unspecified: Secondary | ICD-10-CM | POA: Insufficient documentation

## 2019-07-07 DIAGNOSIS — N3281 Overactive bladder: Secondary | ICD-10-CM | POA: Insufficient documentation

## 2019-07-07 DIAGNOSIS — J309 Allergic rhinitis, unspecified: Secondary | ICD-10-CM

## 2019-07-07 DIAGNOSIS — E663 Overweight: Secondary | ICD-10-CM | POA: Insufficient documentation

## 2019-07-07 DIAGNOSIS — K219 Gastro-esophageal reflux disease without esophagitis: Secondary | ICD-10-CM | POA: Insufficient documentation

## 2019-07-07 DIAGNOSIS — F419 Anxiety disorder, unspecified: Secondary | ICD-10-CM | POA: Insufficient documentation

## 2019-07-07 MED ORDER — MELOXICAM 15 MG PO TABS: 15.0000 mg | ORAL_TABLET | Freq: Every day | ORAL | 0 refills | Status: DC

## 2019-07-07 MED ORDER — TIZANIDINE HCL 4 MG PO TABS: 4.0000 mg | ORAL_TABLET | Freq: Every day | ORAL | 0 refills | Status: DC

## 2019-07-07 NOTE — Progress Notes (Signed)
Patient presents to clinic today to establish care. Patient with a few concerns today she would like to discuss.   Acute Concerns: Patient endorses fatigue over the past few weeks, worsening over the past few days. Notes by 10-11PM getting really tired. Usually stays up to 1 in the morning so this is a change for her. Not currently working and is less active due to [place]. Denies chest pain or shortness of breath with rest or exertion. Denies fevers, chills, change to bowel or bladder habits. Denies recent travel. Denies history of thyroid abnormality, anemia or vitamin deficiency. Denies depressed mood or anxiety.   Patient endorses 1 week of trapezius tension and knotting with some pulling in the chest, worse with ROM. Pain is at rest and non-exertional. Is concerned because there is some radiation to anterior chest. Has a friend who recently had a cardiac event. Is wanting to see Cardiology.   Chronic Issues: Postsurgical Menopause -- Followed by Dr. Stann Mainland at [facility]. Is on Estradiol 1 mg daily. Has done well with vasomotor symptoms with this. .   Hyperlipidemia -- Is currently on rosuvastatin 20 mg QD. Takes as directed and tolerates well. Diet has been worse since COVID. Usually works out with a Clinical research associate a few times per week. Denies personal history of known CAD. No history of needing stress test. Patient denies chest pain, palpitations, lightheadedness, dizziness, vision changes or frequent headaches.  BP Readings from Last 3 Encounters:  07/07/19 110/78  09/22/18 117/77  07/18/18 122/65   Allergic Rhinitis -- Takes levocetirizine once daily with good relief of symptoms. Denies breakthrough symptoms.   Health Maintenance: Immunizations -- Declines flu shot. Unsure of Tetanus. Will get records.  Colonoscopy -- + Family history of  Cancerous polyps (sister). Last colonoscopy at age 28 per patient. Due for repeat.  Mammogram -- UTD PAP -- Followed by GYN. UTD. Will get  records.   No past medical history on file.  Past Surgical History:  Procedure Laterality Date  . ABDOMINAL HYSTERECTOMY    . BLADDER SUSPENSION    . CHOLECYSTECTOMY      Current Outpatient Medications on File Prior to Visit  Medication Sig Dispense Refill  . ESTRADIOL PO Take by mouth.    . ondansetron (ZOFRAN ODT) 4 MG disintegrating tablet Take 1 tablet (4 mg total) by mouth every 8 (eight) hours as needed for nausea or vomiting. 20 tablet 0  . traMADol (ULTRAM) 50 MG tablet Take 1 tablet (50 mg total) by mouth every 6 (six) hours as needed. 14 tablet 0   No current facility-administered medications on file prior to visit.     Allergies  Allergen Reactions  . Ciprofloxacin Hives and Nausea And Vomiting  . Penicillins     REACTION: hives  . Sulfonamide Derivatives     REACTION: rash    No family history on file.  Social History   Socioeconomic History  . Marital status: Married    Spouse name: Not on file  . Number of children: Not on file  . Years of education: Not on file  . Highest education level: Not on file  Occupational History  . Not on file  Social Needs  . Financial resource strain: Not on file  . Food insecurity    Worry: Not on file    Inability: Not on file  . Transportation needs    Medical: Not on file    Non-medical: Not on file  Tobacco Use  . Smoking status: Never  Smoker  . Smokeless tobacco: Never Used  Substance and Sexual Activity  . Alcohol use: Not Currently  . Drug use: Never  . Sexual activity: Not on file  Lifestyle  . Physical activity    Days per week: Not on file    Minutes per session: Not on file  . Stress: Not on file  Relationships  . Social Herbalist on phone: Not on file    Gets together: Not on file    Attends religious service: Not on file    Active member of club or organization: Not on file    Attends meetings of clubs or organizations: Not on file    Relationship status: Not on file  . Intimate  partner violence    Fear of current or ex partner: Not on file    Emotionally abused: Not on file    Physically abused: Not on file    Forced sexual activity: Not on file  Other Topics Concern  . Not on file  Social History Narrative  . Not on file   Review of Systems  Constitutional: Positive for malaise/fatigue. Negative for fever and weight loss.  HENT: Negative for ear discharge, ear pain, hearing loss and tinnitus.   Eyes: Negative for blurred vision, double vision, photophobia and pain.  Respiratory: Negative for cough and shortness of breath.   Cardiovascular: Positive for chest pain. Negative for palpitations.  Gastrointestinal: Negative for abdominal pain, blood in stool, constipation, diarrhea, heartburn, melena, nausea and vomiting.  Genitourinary: Negative for dysuria, flank pain, frequency, hematuria and urgency.  Musculoskeletal: Positive for neck pain. Negative for back pain, falls, joint pain and myalgias.  Neurological: Negative for dizziness, loss of consciousness and headaches.  Endo/Heme/Allergies: Negative for environmental allergies.  Psychiatric/Behavioral: Negative for depression, hallucinations, substance abuse and suicidal ideas. The patient is not nervous/anxious and does not have insomnia.    Resp 16   Ht 5' 8" (1.727 m)   Wt 173 lb (78.5 kg)   BMI 26.30 kg/m   Physical Exam Vitals signs reviewed.  Constitutional:      Appearance: Normal appearance.  HENT:     Head: Normocephalic and atraumatic.     Right Ear: Tympanic membrane normal.     Left Ear: Tympanic membrane normal.     Nose: Nose normal.  Eyes:     Conjunctiva/sclera: Conjunctivae normal.     Pupils: Pupils are equal, round, and reactive to light.  Neck:     Musculoskeletal: Neck supple. Normal range of motion. Muscular tenderness present. No edema or neck rigidity.     Thyroid: No thyroid mass, thyromegaly or thyroid tenderness.  Cardiovascular:     Rate and Rhythm: Normal rate and  regular rhythm.     Pulses: Normal pulses.     Heart sounds: Normal heart sounds.  Pulmonary:     Effort: Pulmonary effort is normal.     Breath sounds: Normal breath sounds.  Chest:     Chest wall: No tenderness.  Abdominal:     General: Bowel sounds are normal.     Palpations: Abdomen is soft.  Lymphadenopathy:     Cervical: No cervical adenopathy.  Neurological:     Mental Status: She is alert.    Assessment/Plan: 1. Chronic fatigue Unclear etiolology. EKG today with NSR. Seems that she is experiencing some level of fatigue due to being less active and that she is feeling tired at 11 PM instead of midnight or 1 am. This is common  and especially with age. Will check full lab panel today to further assess for patient to see what other factors may be present.  - B12 - CBC w/Diff - Comp Met (CMET) - Sedimentation rate - TSH - Vitamin D (25 hydroxy)  2. Atypical chest pain With trapezius tension. Discussed do not feel cardiac related. EKG today with NSR. Will start trial of Mobic and tizanidine. Rest. Gentle stretching. Labs today. If not improving will set her up with Cardiology to make sure they feel no further workup is needed.  - EKG 12-Lead - tiZANidine (ZANAFLEX) 4 MG tablet; Take 1 tablet (4 mg total) by mouth at bedtime.  Dispense: 15 tablet; Refill: 0 - meloxicam (MOBIC) 15 MG tablet; Take 1 tablet (15 mg total) by mouth daily.  Dispense: 20 tablet; Refill: 0  3. Pure hypercholesterolemia Taking statin. Recheck fasting lipids today. - Lipid Profile  4. Postsurgical menopause Continue management per GYN  5. Allergic rhinitis, unspecified seasonality, unspecified trigger Continue chronic regimen.   Leeanne Rio, PA-C

## 2019-07-07 NOTE — Patient Instructions (Signed)
Please go to the lab today for blood work.  I will call you with your results. We will alter treatment regimen(s) if indicated by your results.   Please avoid any heavy lifting or overexertion. Start the Tizanidine taking at night. Take the Meloxicam once daily in the AM with food. Tylenol for breakthrough pain.  We will see how symptoms do over the next week.  If not improving we will proceed with stress testing to be on the safe side.   Follow-up with your eye doctor as scheduled.   If you note any acute worsening of discomfort or note any shortness of breath, lightheadedness or dizziness, please call 911 or have someone take you to the nearest ER

## 2019-07-08 ENCOUNTER — Other Ambulatory Visit: Payer: Self-pay | Admitting: Emergency Medicine

## 2019-07-08 DIAGNOSIS — R748 Abnormal levels of other serum enzymes: Secondary | ICD-10-CM

## 2019-07-08 DIAGNOSIS — R0789 Other chest pain: Secondary | ICD-10-CM

## 2019-07-08 LAB — COMPREHENSIVE METABOLIC PANEL
AG Ratio: 1.5 (calc) (ref 1.0–2.5)
ALT: 53 U/L — ABNORMAL HIGH (ref 6–29)
AST: 48 U/L — ABNORMAL HIGH (ref 10–35)
Albumin: 4.4 g/dL (ref 3.6–5.1)
Alkaline phosphatase (APISO): 58 U/L (ref 37–153)
BUN: 13 mg/dL (ref 7–25)
CO2: 27 mmol/L (ref 20–32)
Calcium: 9.8 mg/dL (ref 8.6–10.4)
Chloride: 100 mmol/L (ref 98–110)
Creat: 0.87 mg/dL (ref 0.50–1.05)
Globulin: 3 g/dL (calc) (ref 1.9–3.7)
Glucose, Bld: 87 mg/dL (ref 65–99)
Potassium: 4.7 mmol/L (ref 3.5–5.3)
Sodium: 138 mmol/L (ref 135–146)
Total Bilirubin: 1.3 mg/dL — ABNORMAL HIGH (ref 0.2–1.2)
Total Protein: 7.4 g/dL (ref 6.1–8.1)

## 2019-07-08 LAB — LIPID PANEL
Cholesterol: 204 mg/dL — ABNORMAL HIGH (ref <200)
HDL: 76 mg/dL (ref >=50)
LDL Cholesterol (Calc): 85 mg/dL (calc)
Non-HDL Cholesterol (Calc): 128 mg/dL (calc) (ref <130)
Total CHOL/HDL Ratio: 2.7 (calc) (ref <5.0)
Triglycerides: 353 mg/dL — ABNORMAL HIGH (ref <150)

## 2019-07-08 LAB — CBC WITH DIFFERENTIAL/PLATELET
Absolute Monocytes: 583 cells/uL (ref 200–950)
Basophils Absolute: 50 cells/uL (ref 0–200)
Basophils Relative: 0.8 %
Eosinophils Absolute: 167 cells/uL (ref 15–500)
Eosinophils Relative: 2.7 %
HCT: 44.2 % (ref 35.0–45.0)
Hemoglobin: 15.2 g/dL (ref 11.7–15.5)
Lymphs Abs: 2182 cells/uL (ref 850–3900)
MCH: 31.3 pg (ref 27.0–33.0)
MCHC: 34.4 g/dL (ref 32.0–36.0)
MCV: 91.1 fL (ref 80.0–100.0)
MPV: 9.5 fL (ref 7.5–12.5)
Monocytes Relative: 9.4 %
Neutro Abs: 3218 cells/uL (ref 1500–7800)
Neutrophils Relative %: 51.9 %
Platelets: 335 10*3/uL (ref 140–400)
RBC: 4.85 10*6/uL (ref 3.80–5.10)
RDW: 12.4 % (ref 11.0–15.0)
Total Lymphocyte: 35.2 %
WBC: 6.2 10*3/uL (ref 3.8–10.8)

## 2019-07-08 LAB — VITAMIN D 25 HYDROXY (VIT D DEFICIENCY, FRACTURES): Vit D, 25-Hydroxy: 32 ng/mL (ref 30–100)

## 2019-07-08 LAB — SEDIMENTATION RATE: Sed Rate: 9 mm/h (ref 0–30)

## 2019-07-08 LAB — VITAMIN B12: Vitamin B-12: 398 pg/mL (ref 200–1100)

## 2019-07-08 LAB — TSH: TSH: 0.78 mIU/L

## 2019-07-14 NOTE — Progress Notes (Signed)
CARDIOLOGY CONSULT NOTE       Patient ID: [name] MRN: [id number] DOB/AGE: 02-25-1966 53 y.o.  Admit date: (Not on file) Referring Physician: Below Hassell Done Primary Physician: Brunetta Jeans, PA-C Primary Cardiologist: New Reason for Consultation: Chest pain   Active Problems:   * No active hospital problems. *   HPI:  53 y.o. history of GERD and HLD referred by primary for atypical chest pain Reviewed note Cyndi Lennert from last month . Patient complained of fatigue over last few weeks. A week of trapezius tension with "pulling" sensation in chest worse with ROM UE;s Pain more at rest than exertion some radiation to anterior chest. Friend recently with heart problem and she is concerned An statin and premature surgical menopause on Estradiol due to vasomotor symptoms Usually works out with trainer few days/ week less recently with COVID Given trial of mobic and tizanidine for ? Muscular pain   Seen in ER 09/22/18 with abdominal pain Diagnosed with diverticulitis on CT Rx antibiotics Known diverticulosis on CT 2017  Pain is atypical not exertional described as pinching Had massage yesterday with not much change Left trapezius muscle is tight  Her son [name] is junior at [employer] and knows my kids  Son [name] is 25 th grader at [employer] is friends with my wife  Been sedentary since [place] All other systems reviewed and negative except as noted above  Past Medical History:  Diagnosis Date  . Allergy   . GERD (gastroesophageal reflux disease)   . History of chickenpox   . Hyperlipidemia     Family History  Problem Relation Age of Onset  . Hyperlipidemia Mother   . Hypertension Mother   . Glaucoma Mother   . Diabetes Father   . Hypertension Father   . Cancer Father        Melanoma  . Colon polyps Sister   . Cancer Sister        Skin    Social History   Socioeconomic History  . Marital status: Married    Spouse name: Not on file  . Number of children:  Not on file  . Years of education: Not on file  . Highest education level: Not on file  Occupational History  . Not on file  Social Needs  . Financial resource strain: Not on file  . Food insecurity    Worry: Not on file    Inability: Not on file  . Transportation needs    Medical: Not on file    Non-medical: Not on file  Tobacco Use  . Smoking status: Never Smoker  . Smokeless tobacco: Never Used  Substance and Sexual Activity  . Alcohol use: Yes    Comment: social  . Drug use: Never  . Sexual activity: Yes    Birth control/protection: Surgical  Lifestyle  . Physical activity    Days per week: Not on file    Minutes per session: Not on file  . Stress: Not on file  Relationships  . Social Herbalist on phone: Not on file    Gets together: Not on file    Attends religious service: Not on file    Active member of club or organization: Not on file    Attends meetings of clubs or organizations: Not on file    Relationship status: Not on file  . Intimate partner violence    Fear of current or ex partner: Not on file  Emotionally abused: Not on file    Physically abused: Not on file    Forced sexual activity: Not on file  Other Topics Concern  . Not on file  Social History Narrative  . Not on file    Past Surgical History:  Procedure Laterality Date  . ABDOMINAL HYSTERECTOMY    . BLADDER SUSPENSION    . CHOLECYSTECTOMY        Physical Exam: Blood pressure 120/68, pulse 68, height _0  (1.727 m), weight 176 lb (79.8 kg), SpO2 98 %.   Affect appropriate Healthy:  appears stated age 8: normal Neck supple with no adenopathy JVP normal no bruits no thyromegaly Lungs clear with no wheezing and good diaphragmatic motion Heart:  S1/S2 no murmur, no rub, gallop or click PMI normal Abdomen: benighn, BS positve, no tenderness, no AAA post gallbladder removal  no bruit.  No HSM or HJR Distal pulses intact with no bruits No edema Neuro non-focal Skin  warm and dry No muscular weakness   Labs:   Lab Results  Component Value Date   WBC 6.2 07/07/2019   HGB 15.2 07/07/2019   HCT 44.2 07/07/2019   MCV 91.1 07/07/2019   PLT 335 07/07/2019    No results for input(s): NA, K, CL, CO2, BUN, CREATININE, CALCIUM, PROT, BILITOT, ALKPHOS, ALT, AST, GLUCOSE in the last 168 hours.  Invalid input(s): LABALBU No results found for: CKTOTAL, CKMB, CKMBINDEX, TROPONINI  Lab Results  Component Value Date   CHOL 204 (H) 07/07/2019   Lab Results  Component Value Date   HDL 76 07/07/2019   Lab Results  Component Value Date   LDLCALC 85 07/07/2019   Lab Results  Component Value Date   TRIG 353 (H) 07/07/2019   Lab Results  Component Value Date   CHOLHDL 2.7 07/07/2019   No results found for: LDLDIRECT    Radiology: No results found.  EKG: NSR rate 74 normal ECG 07/07/19    ASSESSMENT AND PLAN:   1. Chest pain: atypical muscular sounding limited risk factors ECG normal will risk stratify with calcium score if below average for age/sex or 0 no need for ETT 2. Menapause: f/u GYN on estrogen with improvement in vasomotor symptoms  3. HLD:  LDL 85 HDL 76 Triglycerides 353 Continue statin Discussed low fat diet AST/ALT mildley elevated f/u primary  Consider intensifying if high calcium score  4. Fatigue:  Functional labs 11/24 normal including TSH/ESR/Hct no need for echo 5, Diverticulitis:  CT 09/22/18 mild sigmoid inflammation F/U primary    Signed: Jenkins Rouge 07/17/2019, 8:30 AM

## 2019-07-17 ENCOUNTER — Ambulatory Visit
Admission: RE | Admit: 2019-07-17 | Discharge: 2019-07-17 | Disposition: A | Discharge status: Home / Self Care | Payer: BC Managed Care – PPO | Source: Ambulatory Visit | Attending: Cardiovascular Disease | Admitting: Cardiovascular Disease

## 2019-07-17 ENCOUNTER — Ambulatory Visit: Payer: BC Managed Care – PPO | Admitting: Cardiovascular Disease

## 2019-07-17 ENCOUNTER — Other Ambulatory Visit: Payer: Self-pay

## 2019-07-17 ENCOUNTER — Encounter: Payer: Self-pay | Admitting: Cardiovascular Disease

## 2019-07-17 VITALS — BP 120/68 | HR 68 | Ht 68.0 in | Wt 176.0 lb

## 2019-07-17 DIAGNOSIS — E785 Hyperlipidemia, unspecified: Secondary | ICD-10-CM

## 2019-07-17 DIAGNOSIS — R079 Chest pain, unspecified: Secondary | ICD-10-CM | POA: Diagnosis not present

## 2019-07-17 NOTE — Patient Instructions (Addendum)
Medication Instructions:   *If you need a refill on your cardiac medications before your next appointment, please call your pharmacy*  Lab Work:  If you have labs (blood work) drawn today and your tests are completely normal, you will receive your results only by: Marland Kitchen MyChart Message (if you have MyChart) OR . A paper copy in the mail If you have any lab test that is abnormal or we need to change your treatment, we will call you to review the results.  Testing/Procedures: Cardiac CT scanning for calcium score, (CAT scanning), is a noninvasive, special x-ray that produces cross-sectional images of the body using x-rays and a computer. CT scans help physicians diagnose and treat medical conditions. For some CT exams, a contrast material is used to enhance visibility in the area of the body being studied. CT scans provide greater clarity and reveal more details than regular x-ray exams.  Follow-Up: At [facility], you and your health needs are our priority.  As part of our continuing mission to provide you with exceptional heart care, we have created designated Provider Care Teams.  These Care Teams include your primary Cardiologist (physician) and Advanced Practice Providers (APPs -  Physician Assistants and Nurse Practitioners) who all work together to provide you with the care you need, when you need it.  Your next appointment:   As needed  The format for your next appointment:   In Person  Provider:   You may see Dr. Johnsie Cancel or one of the following Advanced Practice Providers on your designated Care Team:    Truitt Merle, NP  Cecilie Kicks, NP  Kathyrn Drown, NP

## 2019-08-21 ENCOUNTER — Encounter: Payer: Self-pay | Admitting: Physician Assistant

## 2019-08-21 DIAGNOSIS — D1803 Hemangioma of intra-abdominal structures: Secondary | ICD-10-CM | POA: Insufficient documentation

## 2019-08-21 DIAGNOSIS — E041 Nontoxic single thyroid nodule: Secondary | ICD-10-CM | POA: Insufficient documentation

## 2019-08-24 ENCOUNTER — Encounter: Payer: Self-pay | Admitting: Emergency Medicine

## 2019-10-19 ENCOUNTER — Encounter: Payer: Self-pay | Admitting: Physician Assistant

## 2019-10-19 ENCOUNTER — Other Ambulatory Visit: Payer: Self-pay

## 2019-10-19 ENCOUNTER — Ambulatory Visit (INDEPENDENT_AMBULATORY_CARE_PROVIDER_SITE_OTHER): Payer: BC Managed Care – PPO | Admitting: Physician Assistant

## 2019-10-19 DIAGNOSIS — B9689 Other specified bacterial agents as the cause of diseases classified elsewhere: Secondary | ICD-10-CM | POA: Diagnosis not present

## 2019-10-19 DIAGNOSIS — J019 Acute sinusitis, unspecified: Secondary | ICD-10-CM

## 2019-10-19 DIAGNOSIS — E785 Hyperlipidemia, unspecified: Secondary | ICD-10-CM

## 2019-10-19 MED ORDER — TRIAMCINOLONE ACETONIDE 55 MCG/ACT NA AERO: 2.0000 | INHALATION_SPRAY | Freq: Every day | NASAL | 12 refills | Status: DC

## 2019-10-19 MED ORDER — AZITHROMYCIN 250 MG PO TABS: ORAL_TABLET | ORAL | 0 refills | Status: DC

## 2019-10-19 NOTE — Patient Instructions (Signed)
Instructions sent to MyChart  Please keep up with daily Crestor. Try to help work up to a goal of 150 minutes of aerobic exercise per week. Continue trying to cook most meals at home. Once you are back in town and you are feeling better from your sinus infection, please schedule an appointment to recheck blood work.   Please take antibiotic as directed.  Increase fluid intake.  Use Saline nasal spray.  Take a daily multivitamin. Continue Zyrtec daily. Start Nasacort.  Place a humidifier in the bedroom.  Please call or return clinic if symptoms are not improving.  Sinusitis Sinusitis is redness, soreness, and swelling (inflammation) of the paranasal sinuses. Paranasal sinuses are air pockets within the bones of your face (beneath the eyes, the middle of the forehead, or above the eyes). In healthy paranasal sinuses, mucus is able to drain out, and air is able to circulate through them by way of your nose. However, when your paranasal sinuses are inflamed, mucus and air can become trapped. This can allow bacteria and other germs to grow and cause infection. Sinusitis can develop quickly and last only a short time (acute) or continue over a long period (chronic). Sinusitis that lasts for more than 12 weeks is considered chronic.  CAUSES  Causes of sinusitis include:  Allergies.  Structural abnormalities, such as displacement of the cartilage that separates your nostrils (deviated septum), which can decrease the air flow through your nose and sinuses and affect sinus drainage.  Functional abnormalities, such as when the small hairs (cilia) that line your sinuses and help remove mucus do not work properly or are not present. SYMPTOMS  Symptoms of acute and chronic sinusitis are the same. The primary symptoms are pain and pressure around the affected sinuses. Other symptoms include:  Upper toothache.  Earache.  Headache.  Bad breath.  Decreased sense of smell and taste.  A cough, which  worsens when you are lying flat.  Fatigue.  Fever.  Thick drainage from your nose, which often is green and may contain pus (purulent).  Swelling and warmth over the affected sinuses. DIAGNOSIS  Your caregiver will perform a physical exam. During the exam, your caregiver may:  Look in your nose for signs of abnormal growths in your nostrils (nasal polyps).  Tap over the affected sinus to check for signs of infection.  View the inside of your sinuses (endoscopy) with a special imaging device with a light attached (endoscope), which is inserted into your sinuses. If your caregiver suspects that you have chronic sinusitis, one or more of the following tests may be recommended:  Allergy tests.  Nasal culture A sample of mucus is taken from your nose and sent to a lab and screened for bacteria.  Nasal cytology A sample of mucus is taken from your nose and examined by your caregiver to determine if your sinusitis is related to an allergy. TREATMENT  Most cases of acute sinusitis are related to a viral infection and will resolve on their own within 10 days. Sometimes medicines are prescribed to help relieve symptoms (pain medicine, decongestants, nasal steroid sprays, or saline sprays).  However, for sinusitis related to a bacterial infection, your caregiver will prescribe antibiotic medicines. These are medicines that will help kill the bacteria causing the infection.  Rarely, sinusitis is caused by a fungal infection. In theses cases, your caregiver will prescribe antifungal medicine. For some cases of chronic sinusitis, surgery is needed. Generally, these are cases in which sinusitis recurs more than 3  times per year, despite other treatments. HOME CARE INSTRUCTIONS   Drink plenty of water. Water helps thin the mucus so your sinuses can drain more easily.  Use a humidifier.  Inhale steam 3 to 4 times a day (for example, sit in the bathroom with the shower running).  Apply a warm,  moist washcloth to your face 3 to 4 times a day, or as directed by your caregiver.  Use saline nasal sprays to help moisten and clean your sinuses.  Take over-the-counter or prescription medicines for pain, discomfort, or fever only as directed by your caregiver. SEEK IMMEDIATE MEDICAL CARE IF:  You have increasing pain or severe headaches.  You have nausea, vomiting, or drowsiness.  You have swelling around your face.  You have vision problems.  You have a stiff neck.  You have difficulty breathing. MAKE SURE YOU:   Understand these instructions.  Will watch your condition.  Will get help right away if you are not doing well or get worse. Document Released: 07/30/2005 Document Revised: 10/22/2011 Document Reviewed: 08/14/2011 [facility] Patient Information 2014 [place], [place].

## 2019-10-19 NOTE — Progress Notes (Signed)
I have discussed the procedure for the virtual visit with the patient who has given consent to proceed with assessment and treatment.   Zamiya Dillard S Demario Faniel, CMA     

## 2019-10-19 NOTE — Progress Notes (Signed)
Virtual Visit via Video   I connected with patient on 10/19/19 at  1:30 PM EST by a video enabled telemedicine application and verified that I am speaking with the correct person using two identifiers.  Location patient: Home Location provider: [facility], Office Persons participating in the virtual visit: Patient, Provider, [place] (Patina Moore)  I discussed the limitations of evaluation and management by telemedicine and the availability of in person appointments. The patient expressed understanding and agreed to proceed.  Subjective:   HPI:   Patient presents via [employer] today for follow-up of hyperlipidemia.  Patient is currently on a regimen of rosuvastatin 20 mg daily.  Endorses taking as directed.  Notes exercise and diet are not what they used to be, but has been keeping quarantined with Covid.  Is trying to work on getting back into walking daily.  Does cook most meals at home.  Patient also notes about 2 weeks of nasal congestion with rhinorrhea and PND. Notes sinus pain without tooth pain. Denies SOB or wheezing. Denies loss of taste/smell. Denies recent travel or sick contact. Typically follows with her allergist but has not been able to see them. Is taking Zyrtec daily.   ROS:   See pertinent positives and negatives per HPI.  Patient Active Problem List   Diagnosis Date Noted  . 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 Thyroid nodule 08/21/2019  . Pure hypercholesterolemia 07/07/2019  . Major depression, single episode 07/07/2019  . Herpes simplex 07/07/2019  . GERD (gastroesophageal reflux disease) 07/07/2019  . Anxiety 07/07/2019  . Overactive bladder 07/07/2019  . Postsurgical menopause 08/13/2009  . CHEST PAIN 11/19/2008  . COUGH 11/04/2008  . DEPRESSION 11/03/2008  . Allergic rhinitis 11/03/2008    Social History   Tobacco Use  . Smoking status: Never Smoker  . Smokeless tobacco: Never Used  Substance Use Topics  . Alcohol use: Yes   Comment: social    Current Outpatient Medications:  .  cetirizine (ZYRTEC) 10 MG tablet, Take 10 mg by mouth at bedtime., Disp: , Rfl:  .  cholecalciferol (VITAMIN D) 25 MCG (1000 UT) tablet, Take 1,000 Units by mouth daily., Disp: , Rfl:  .  estradiol (ESTRACE) 1 MG tablet, Take 1 tablet by mouth daily. , Disp: , Rfl:  .  rosuvastatin (CRESTOR) 20 MG tablet, Take 1 tablet by mouth every evening., Disp: , Rfl:  .  levocetirizine (XYZAL) 5 MG tablet, Take 1 tablet by mouth daily., Disp: , Rfl:   Allergies  Allergen Reactions  . Ciprofloxacin Hives and Nausea And Vomiting  . Codeine   . Doxycycline Itching  . Iodine   . Penicillins     REACTION: hives  . Sulfonamide Derivatives     REACTION: rash    Objective:   There were no vitals taken for this visit.  Patient is well-developed, well-nourished in no acute distress.  Resting comfortably at home.  Head is normocephalic, atraumatic.  No labored breathing.  Speech is clear and coherent with logical content.  Patient is alert and oriented at baseline.   Assessment and Plan:   1. Hyperlipidemia, unspecified hyperlipidemia type Patient is taking statin as directed.  Dietary and exercise recommendations again reviewed.  Orders for future labs placed to recheck lipids and LFTs.  She will schedule this when she is back in town  2. Acute bacterial sinusitis Rx Azithromycin.  Increase fluids.  Rest.  Saline nasal spray.  Probiotic.  Mucinex as directed.  Humidifier in bedroom.  Start Nasacort.  Continue Zyrtec daily call or return to clinic if symptoms are not improving.     Leeanne Rio, [place] 10/19/2019

## 2019-10-21 ENCOUNTER — Ambulatory Visit: Payer: BC Managed Care – PPO | Admitting: Physician Assistant

## 2019-10-26 LAB — CBC: RBC: 4.67 (ref 3.87–5.11)

## 2019-10-26 LAB — HEPATIC FUNCTION PANEL
ALT: 76 — AB (ref 7–35)
AST: 86 — AB (ref 13–35)
Alkaline Phosphatase: 63 (ref 25–125)
Bilirubin, Total: 1.5

## 2019-10-26 LAB — TSH
TSH: 1.31 (ref 0.41–5.90)
TSH: 1.31 (ref 0.41–5.90)

## 2019-10-26 LAB — COMPREHENSIVE METABOLIC PANEL
Albumin: 4.5 (ref 3.5–5.0)
Calcium: 9.7 (ref 8.7–10.7)
GFR calc non Af Amer: 69
Globulin: 2.5

## 2019-10-26 LAB — CBC AND DIFFERENTIAL
HCT: 43 (ref 36–46)
Hemoglobin: 14.7 (ref 12.0–16.0)
Platelets: 301 (ref 150–399)
WBC: 5.7

## 2019-10-26 LAB — HEMOGLOBIN A1C
Hemoglobin A1C: 5.5
Hemoglobin A1C: 5.5

## 2019-10-26 LAB — LIPID PANEL
Cholesterol: 192 (ref 0–200)
Cholesterol: 192 (ref 0–200)
HDL: 69 (ref 35–70)
HDL: 69 (ref 35–70)
LDL Cholesterol: 90
Triglycerides: 198 — AB (ref 40–160)
Triglycerides: 198 — AB (ref 40–160)

## 2019-10-26 LAB — BASIC METABOLIC PANEL
BUN: 13 (ref 4–21)
CO2: 24 — AB (ref 13–22)
Chloride: 101 (ref 99–108)
Creatinine: 1 (ref 0.5–1.1)
Glucose: 96
Potassium: 4.1 (ref 3.4–5.3)
Sodium: 143 (ref 137–147)

## 2019-10-28 ENCOUNTER — Encounter: Payer: Self-pay | Admitting: Physician Assistant

## 2019-10-29 ENCOUNTER — Other Ambulatory Visit: Payer: Self-pay | Admitting: Obstetrics & Gynecology

## 2019-10-29 DIAGNOSIS — R1032 Left lower quadrant pain: Secondary | ICD-10-CM

## 2019-11-01 ENCOUNTER — Encounter: Payer: Self-pay | Admitting: Physician Assistant

## 2019-11-09 ENCOUNTER — Ambulatory Visit
Admission: RE | Admit: 2019-11-09 | Discharge: 2019-11-09 | Disposition: A | Discharge status: Home / Self Care | Payer: BC Managed Care – PPO | Source: Ambulatory Visit | Attending: Obstetrics & Gynecology | Admitting: Obstetrics & Gynecology

## 2019-11-09 DIAGNOSIS — R1032 Left lower quadrant pain: Secondary | ICD-10-CM

## 2019-11-09 MED ORDER — IOPAMIDOL (ISOVUE-300) INJECTION 61%
100.0000 mL | Freq: Once | INTRAVENOUS | Status: AC | PRN
  Administered 2019-11-09: 100 mL via INTRAVENOUS

## 2019-11-10 ENCOUNTER — Encounter: Payer: Self-pay | Admitting: Physician Assistant

## 2019-11-11 ENCOUNTER — Ambulatory Visit (INDEPENDENT_AMBULATORY_CARE_PROVIDER_SITE_OTHER): Payer: BC Managed Care – PPO

## 2019-11-11 ENCOUNTER — Other Ambulatory Visit: Payer: Self-pay

## 2019-11-11 ENCOUNTER — Other Ambulatory Visit: Payer: Self-pay | Admitting: Physician Assistant

## 2019-11-11 DIAGNOSIS — R7989 Other specified abnormal findings of blood chemistry: Secondary | ICD-10-CM

## 2019-11-12 ENCOUNTER — Encounter: Payer: Self-pay | Admitting: Physician Assistant

## 2019-11-12 DIAGNOSIS — R7989 Other specified abnormal findings of blood chemistry: Secondary | ICD-10-CM

## 2019-11-12 LAB — REFLEX TIQ

## 2019-11-12 LAB — ACUTE HEP PANEL AND HEP B SURFACE AB
HEPATITIS C ANTIBODY REFILL$(REFL): NONREACTIVE
Hep A IgM: NONREACTIVE
Hep B C IgM: NONREACTIVE
Hepatitis B Surface Ag: NONREACTIVE
SIGNAL TO CUT-OFF: 0.02 (ref <1.00)

## 2019-11-18 ENCOUNTER — Ambulatory Visit
Admission: RE | Admit: 2019-11-18 | Discharge: 2019-11-18 | Disposition: A | Discharge status: Home / Self Care | Payer: BC Managed Care – PPO | Source: Ambulatory Visit | Attending: Physician Assistant | Admitting: Physician Assistant

## 2019-11-18 DIAGNOSIS — R7989 Other specified abnormal findings of blood chemistry: Secondary | ICD-10-CM

## 2020-02-04 ENCOUNTER — Other Ambulatory Visit: Payer: Self-pay | Admitting: Obstetrics & Gynecology

## 2020-02-04 DIAGNOSIS — R928 Other abnormal and inconclusive findings on diagnostic imaging of breast: Secondary | ICD-10-CM

## 2020-02-04 DIAGNOSIS — N6001 Solitary cyst of right breast: Secondary | ICD-10-CM

## 2020-02-24 ENCOUNTER — Ambulatory Visit
Admission: RE | Admit: 2020-02-24 | Discharge: 2020-02-24 | Disposition: A | Discharge status: Home / Self Care | Payer: BC Managed Care – PPO | Source: Ambulatory Visit | Attending: Obstetrics & Gynecology | Admitting: Obstetrics & Gynecology

## 2020-02-24 ENCOUNTER — Other Ambulatory Visit: Payer: Self-pay

## 2020-02-24 DIAGNOSIS — N6001 Solitary cyst of right breast: Secondary | ICD-10-CM

## 2020-02-24 DIAGNOSIS — R928 Other abnormal and inconclusive findings on diagnostic imaging of breast: Secondary | ICD-10-CM

## 2020-02-24 LAB — HM MAMMOGRAPHY

## 2020-03-02 ENCOUNTER — Encounter: Payer: Self-pay | Admitting: Physician Assistant

## 2020-03-14 ENCOUNTER — Other Ambulatory Visit: Payer: Self-pay

## 2020-03-14 ENCOUNTER — Ambulatory Visit (INDEPENDENT_AMBULATORY_CARE_PROVIDER_SITE_OTHER): Payer: BC Managed Care – PPO

## 2020-03-14 DIAGNOSIS — R7989 Other specified abnormal findings of blood chemistry: Secondary | ICD-10-CM

## 2020-03-14 LAB — HEPATIC FUNCTION PANEL
ALT: 15 U/L (ref 0–35)
AST: 16 U/L (ref 0–37)
Albumin: 4.3 g/dL (ref 3.5–5.2)
Alkaline Phosphatase: 69 U/L (ref 39–117)
Bilirubin, Direct: 0.1 mg/dL (ref 0.0–0.3)
Total Bilirubin: 1.3 mg/dL — ABNORMAL HIGH (ref 0.2–1.2)
Total Protein: 7.2 g/dL (ref 6.0–8.3)

## 2020-03-14 NOTE — Progress Notes (Unsigned)
patic

## 2020-06-16 LAB — HEPATIC FUNCTION PANEL
ALT: 11 (ref 7–35)
AST: 13 (ref 13–35)
Alkaline Phosphatase: 69 (ref 25–125)
Bilirubin, Direct: 0.23 (ref 0.01–0.4)
Bilirubin, Total: 1.1

## 2020-06-16 LAB — TSH: TSH: 0.69 (ref 0.41–5.90)

## 2020-06-16 LAB — BASIC METABOLIC PANEL
BUN: 19 (ref 4–21)
CO2: 27 — AB (ref 13–22)
Chloride: 100 (ref 99–108)
Creatinine: 0.9 (ref 0.5–1.1)
Glucose: 88
Potassium: 4.6 (ref 3.4–5.3)
Sodium: 139 (ref 137–147)

## 2020-06-16 LAB — COMPREHENSIVE METABOLIC PANEL
Albumin: 4.4 (ref 3.5–5.0)
Calcium: 9.6 (ref 8.7–10.7)
GFR calc Af Amer: 87
GFR calc non Af Amer: 76

## 2020-06-16 LAB — CBC AND DIFFERENTIAL
HCT: 45 (ref 36–46)
Hemoglobin: 15.1 (ref 12.0–16.0)
Platelets: 351 (ref 150–399)
WBC: 5.9

## 2020-06-16 LAB — CBC: RBC: 4.76 (ref 3.87–5.11)

## 2020-08-29 ENCOUNTER — Encounter: Payer: Self-pay | Admitting: Physician Assistant

## 2020-08-30 NOTE — Telephone Encounter (Signed)
Would recommend she do add the zinc daily.  Her body has to fight this off itself at this point. If anything worsens with sinus, recommend video visit for assessment as she can develop a secondary bacterial infection.

## 2020-09-02 NOTE — Telephone Encounter (Signed)
I have scheduled her for a video visit with Wilfred Lacy on 09/03/20 at 930am

## 2020-09-03 ENCOUNTER — Encounter: Payer: Self-pay | Admitting: Nurse Practitioner

## 2020-09-03 ENCOUNTER — Telehealth (INDEPENDENT_AMBULATORY_CARE_PROVIDER_SITE_OTHER): Payer: BC Managed Care – PPO | Admitting: Nurse Practitioner

## 2020-09-03 VITALS — HR 74 | Ht 68.0 in | Wt 140.0 lb

## 2020-09-03 DIAGNOSIS — J0140 Acute pansinusitis, unspecified: Secondary | ICD-10-CM | POA: Diagnosis not present

## 2020-09-03 DIAGNOSIS — H6692 Otitis media, unspecified, left ear: Secondary | ICD-10-CM | POA: Diagnosis not present

## 2020-09-03 DIAGNOSIS — U071 COVID-19: Secondary | ICD-10-CM

## 2020-09-03 MED ORDER — PROMETHAZINE-DM 6.25-15 MG/5ML PO SYRP: 5.0000 mL | ORAL_SOLUTION | Freq: Three times a day (TID) | ORAL | 0 refills | Status: DC | PRN

## 2020-09-03 MED ORDER — ALBUTEROL SULFATE HFA 108 (90 BASE) MCG/ACT IN AERS: 1.0000 | INHALATION_SPRAY | Freq: Four times a day (QID) | RESPIRATORY_TRACT | 0 refills | Status: DC | PRN

## 2020-09-03 MED ORDER — PREDNISONE 20 MG PO TABS: ORAL_TABLET | ORAL | 0 refills | Status: AC

## 2020-09-03 MED ORDER — AZITHROMYCIN 250 MG PO TABS: 250.0000 mg | ORAL_TABLET | Freq: Every day | ORAL | 0 refills | Status: DC

## 2020-09-03 NOTE — Progress Notes (Signed)
Virtual Visit via Video Note  I connected with@ on 09/03/20 at  9:30 AM EST by a video enabled telemedicine application and verified that I am speaking with the correct person using two identifiers.  Location: Patient:Home Provider: Office Participants: patient and provider  I discussed the limitations of evaluation and management by telemedicine and the availability of in person appointments. I also discussed with the patient that there may be a patient responsible charge related to this service. The patient expressed understanding and agreed to proceed.  CC:Pt c/o a dry cough, nasal and chest congestion, along with ringing in the ears since 08/27/20. Pt tested positive for COVID  on 08/29/20. Pt has been taking mucinex, claritin, zinc, nasal spray and vitamin d.  History of Present Illness: She concerned about possible side effects with oral prednisone, hence she agreed to only take 3daoses for now. Ear Fullness  There is pain in the left ear. This is a new problem. The current episode started in the past 7 days. The problem occurs constantly. The problem has been unchanged. There has been no fever. Associated symptoms include coughing and headaches. Pertinent negatives include no abdominal pain, diarrhea, ear discharge, hearing loss, neck pain, rash, rhinorrhea, sore throat or vomiting. She has tried acetaminophen and NSAIDs for the symptoms. The treatment provided mild relief. There is no history of a chronic ear infection, hearing loss or a tympanostomy tube.  Cough This is a new problem. The current episode started in the past 7 days. The problem has been unchanged. The problem occurs constantly. The cough is non-productive. Associated symptoms include ear congestion, ear pain, headaches, nasal congestion and postnasal drip. Pertinent negatives include no chest pain, chills, fever, heartburn, hemoptysis, myalgias, rash, rhinorrhea, sore throat, shortness of breath, sweats, weight loss or  wheezing. The symptoms are aggravated by lying down. She has tried OTC cough suppressant for the symptoms. The treatment provided no relief. Her past medical history is significant for environmental allergies.   Observations/Objective: Physical Exam Vitals reviewed.  Constitutional:      General: She is not in acute distress.    Appearance: She is ill-appearing.  HENT:     Right Ear: External ear normal.     Left Ear: External ear normal.  Cardiovascular:     Pulses: Normal pulses.  Pulmonary:     Effort: Pulmonary effort is normal.  Musculoskeletal:     Cervical back: Normal range of motion and neck supple.  Neurological:     Mental Status: She is alert and oriented to person, place, and time.    Assessment and Plan: Ayris was seen today for acute visit.  Diagnoses and all orders for this visit:  COVID-19 -     promethazine-dextromethorphan (PROMETHAZINE-DM) 6.25-15 MG/5ML syrup; Take 5 mLs by mouth 3 (three) times daily as needed for cough. -     albuterol (VENTOLIN HFA) 108 (90 Base) MCG/ACT inhaler; Inhale 1-2 puffs into the lungs every 6 (six) hours as needed for wheezing or shortness of breath. -     predniSONE (DELTASONE) 20 MG tablet; Take 2 tablets (40 mg total) by mouth daily with breakfast for 1 day, THEN 1.5 tablets (30 mg total) daily with breakfast for 2 days.  Acute non-recurrent pansinusitis -     promethazine-dextromethorphan (PROMETHAZINE-DM) 6.25-15 MG/5ML syrup; Take 5 mLs by mouth 3 (three) times daily as needed for cough. -     albuterol (VENTOLIN HFA) 108 (90 Base) MCG/ACT inhaler; Inhale 1-2 puffs into the lungs every 6 (six)  hours as needed for wheezing or shortness of breath. -     predniSONE (DELTASONE) 20 MG tablet; Take 2 tablets (40 mg total) by mouth daily with breakfast for 1 day, THEN 1.5 tablets (30 mg total) daily with breakfast for 2 days. -     azithromycin (ZITHROMAX Z-PAK) 250 MG tablet; Take 1 tablet (250 mg total) by mouth daily. Take 2tabs  on first day, then 1tab once a day till complete  Left otitis media, unspecified otitis media type -     azithromycin (ZITHROMAX Z-PAK) 250 MG tablet; Take 1 tablet (250 mg total) by mouth daily. Take 2tabs on first day, then 1tab once a day till complete   Follow Up Instructions: See above  hold mucinex, claritin and nasocort. Call office if no improvement in 1week.  I discussed the assessment and treatment plan with the patient. The patient was provided an opportunity to ask questions and all were answered. The patient agreed with the plan and demonstrated an understanding of the instructions.   The patient was advised to call back or seek an in-person evaluation if the symptoms worsen or if the condition fails to improve as anticipated.  Wilfred Lacy, NP

## 2020-09-03 NOTE — Patient Instructions (Signed)
hold mucinex, claritin and nasocort. Call office if no improvement in 1week  COVID-19: What to Do if You Are Sick If you have a fever, cough or other symptoms, you might have COVID-19. Most people have mild illness and are able to recover at home. If you are sick:  Keep track of your symptoms.  If you have an emergency warning sign (including trouble breathing), call 911. Steps to help prevent the spread of COVID-19 if you are sick If you are sick with COVID-19 or think you might have COVID-19, follow the steps below to care for yourself and to help protect other people in your home and community. Stay home except to get medical care  Stay home. Most people with COVID-19 have mild illness and can recover at home without medical care. Do not leave your home, except to get medical care. Do not visit public areas.  Take care of yourself. Get rest and stay hydrated. Take over-the-counter medicines, such as acetaminophen, to help you feel better.  Stay in touch with your doctor. Call before you get medical care. Be sure to get care if you have trouble breathing, or have any other emergency warning signs, or if you think it is an emergency.  Avoid public transportation, ride-sharing, or taxis. Separate yourself from other people As much as possible, stay in a specific room and away from other people and pets in your home. If possible, you should use a separate bathroom. If you need to be around other people or animals in or outside of the home, wear a mask. Tell your close contactsthat they may have been exposed to COVID-19. An infected person can spread COVID-19 starting 48 hours (or 2 days) before the person has any symptoms or tests positive. By letting your close contacts know they may have been exposed to COVID-19, you are helping to protect everyone.  Additional guidance is available for those living in close quarters and shared housing.  See COVID-19 and Animals if you have questions  about pets.  If you are diagnosed with COVID-19, someone from the health department may call you. Answer the call to slow the spread. Monitor your symptoms  Symptoms of COVID-19 include fever, cough, or other symptoms.  Follow care instructions from your healthcare provider and local health department. Your local health authorities may give instructions on checking your symptoms and reporting information. When to seek emergency medical attention Look for emergency warning signs* for COVID-19. If someone is showing any of these signs, seek emergency medical care immediately:  Trouble breathing  Persistent pain or pressure in the chest  New confusion  Inability to wake or stay awake  Pale, gray, or blue-colored skin, lips, or nail beds, depending on skin tone *This list is not all possible symptoms. Please call your medical provider for any other symptoms that are severe or concerning to you. Call 911 or call ahead to your local emergency facility: Notify the operator that you are seeking care for someone who has or may have COVID-19. Call ahead before visiting your doctor  Call ahead. Many medical visits for routine care are being postponed or done by phone or telemedicine.  If you have a medical appointment that cannot be postponed, call your doctor's office, and tell them you have or may have COVID-19. This will help the office protect themselves and other patients. Get  tested  If you have symptoms of COVID-19, get tested. While waiting for test results, you stay away from others, including staying apart  from those living in your household.  You can visit your state, tribal, local, and territorialhealth department's website to look for the latest local information on testing sites. If you are sick, wear a mask over your nose and mouth  You should wear a mask over your nose and mouth if you must be around other people or animals, including pets (even at home).  You don't need to  wear the mask if you are alone. If you can't put on a mask (because of trouble breathing, for example), cover your coughs and sneezes in some other way. Try to stay at least 6 feet away from other people. This will help protect the people around you.  Masks should not be placed on young children under age 57 years, anyone who has trouble breathing, or anyone who is not able to remove the mask without help. Note: During the COVID-19 pandemic, medical grade facemasks are reserved for healthcare workers and some first responders. Cover your coughs and sneezes  Cover your mouth and nose with a tissue when you cough or sneeze.  Throw away used tissues in a lined trash can.  Immediately wash your hands with soap and water for at least 20 seconds. If soap and water are not available, clean your hands with an alcohol-based hand sanitizer that contains at least 60% alcohol. Clean your hands often  Wash your hands often with soap and water for at least 20 seconds. This is especially important after blowing your nose, coughing, or sneezing; going to the bathroom; and before eating or preparing food.  Use hand sanitizer if soap and water are not available. Use an alcohol-based hand sanitizer with at least 60% alcohol, covering all surfaces of your hands and rubbing them together until they feel dry.  Soap and water are the best option, especially if hands are visibly dirty.  Avoid touching your eyes, nose, and mouth with unwashed hands.  Handwashing Tips Avoid sharing personal household items  Do not share dishes, drinking glasses, cups, eating utensils, towels, or bedding with other people in your home.  Wash these items thoroughly after using them with soap and water or put in the dishwasher. Clean all "high-touch" surfaces everyday  Clean and disinfect high-touch surfaces in your "sick room" and bathroom; wear disposable gloves. Let someone else clean and disinfect surfaces in common areas, but  you should clean your bedroom and bathroom, if possible.  If a caregiver or other person needs to clean and disinfect a sick person's bedroom or bathroom, they should do so on an as-needed basis. The caregiver/other person should wear a mask and disposable gloves prior to cleaning. They should wait as long as possible after the person who is sick has used the bathroom before coming in to clean and use the bathroom. ? High-touch surfaces include phones, remote controls, counters, tabletops, doorknobs, bathroom fixtures, toilets, keyboards, tablets, and bedside tables.  Clean and disinfect areas that may have blood, stool, or body fluids on them.  Use household cleaners and disinfectants. Clean the area or item with soap and water or another detergent if it is dirty. Then, use a household disinfectant. ? Be sure to follow the instructions on the label to ensure safe and effective use of the product. Many products recommend keeping the surface wet for several minutes to ensure germs are killed. Many also recommend precautions such as wearing gloves and making sure you have good ventilation during use of the product. ? Use a product from [employer] List  N: Disinfectants for Coronavirus (COVID-19). ? Complete Disinfection Guidance When you can be around others after being sick with COVID-19 Deciding when you can be around others is different for different situations. Find out when you can safely end home isolation. For any additional questions about your care, contact your healthcare provider or state or local health department. 10/28/2019 Content source: [facility] ([facility]), Division of Viral Diseases This information is not intended to replace advice given to you by your health care provider. Make sure you discuss any questions you have with your health care provider. Document Revised: 06/13/2020 Document Reviewed: 06/13/2020 Elsevier Patient Education  2021  [employer].

## 2020-09-19 ENCOUNTER — Encounter: Payer: Self-pay | Admitting: Family Medicine

## 2020-09-19 ENCOUNTER — Ambulatory Visit: Payer: BC Managed Care – PPO | Admitting: Family Medicine

## 2020-09-19 ENCOUNTER — Other Ambulatory Visit: Payer: Self-pay

## 2020-09-19 VITALS — BP 118/82 | HR 77 | Temp 97.5°F | Ht 68.0 in | Wt 144.8 lb

## 2020-09-19 DIAGNOSIS — Z800 Family history of malignant neoplasm of digestive organs: Secondary | ICD-10-CM | POA: Insufficient documentation

## 2020-09-19 DIAGNOSIS — Z1211 Encounter for screening for malignant neoplasm of colon: Secondary | ICD-10-CM | POA: Insufficient documentation

## 2020-09-19 DIAGNOSIS — R141 Gas pain: Secondary | ICD-10-CM | POA: Insufficient documentation

## 2020-09-19 DIAGNOSIS — R002 Palpitations: Secondary | ICD-10-CM

## 2020-09-19 DIAGNOSIS — H9313 Tinnitus, bilateral: Secondary | ICD-10-CM | POA: Diagnosis not present

## 2020-09-19 DIAGNOSIS — H6123 Impacted cerumen, bilateral: Secondary | ICD-10-CM | POA: Diagnosis not present

## 2020-09-19 DIAGNOSIS — R143 Flatulence: Secondary | ICD-10-CM | POA: Insufficient documentation

## 2020-09-19 DIAGNOSIS — K5730 Diverticulosis of large intestine without perforation or abscess without bleeding: Secondary | ICD-10-CM | POA: Insufficient documentation

## 2020-09-19 DIAGNOSIS — U071 COVID-19: Secondary | ICD-10-CM | POA: Diagnosis not present

## 2020-09-19 DIAGNOSIS — K760 Fatty (change of) liver, not elsewhere classified: Secondary | ICD-10-CM | POA: Insufficient documentation

## 2020-09-19 DIAGNOSIS — R1310 Dysphagia, unspecified: Secondary | ICD-10-CM | POA: Insufficient documentation

## 2020-09-19 NOTE — Progress Notes (Signed)
Subjective  CC:  Chief Complaint  Patient presents with  . Ear Ringing    Bilateral Started the 15th of January when she had covid.    . Palpitations    Started last week. Patient states that she notices them more when she lays down yesterday and today she notices them when she's standing up also.   [name]    Patient states that sometimes she has a "wave" come across her and she feels like she's going to be dizzy. When she drove for the first time last week she had to really remember how to get from point a to point b. When she's talking to people sometimes she forgets what she's trying to say.    Same day acute visit; PCP not available. New pt to me. Chart reviewed.   HPI: [name] is a 55 y.o. female who presents to the office today to address the problems listed above in the chief complaint.  55 year old female who was diagnosed with Covid about 3 weeks ago.  She has had mild respiratory symptoms but also has had tinnitus brain Trujillo and now palpitations over the last week without chest pain or shortness of breath.  No cough.  She was treated with Z-Pak and 3-day course of prednisone.  She has an appointment with ENT due to the tinnitus.  She is frustrated by the persistence of her symptoms.  She has had no GI symptoms.  No significant fatigue, she has had some occipital headaches.  She is vaccinated but has not yet received her booster.   Assessment  1. COVID-19   2. Tinnitus of both ears   3. Palpitation   4. Bilateral impacted cerumen      Plan   COVID-19 infection: Her multiple symptoms are most likely related to the COVID-19 infection.  Education given.  No reflex identified.  Normal vitals, normal heart exam, clear lungs.  Follow-up with ENT if tinnitus persists.  Continue Flonase and Zyrtec.  Keep hydrated and avoid stimulants and decongestants.  Follow up: As needed Visit date not found  No orders of the defined types were placed in this encounter.  No  orders of the defined types were placed in this encounter.     I reviewed the patients updated PMH, FH, and SocHx.    Patient Active Problem List   Diagnosis Date Noted  . Colon cancer screening 09/19/2020  . 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 Dysphagia 09/19/2020  . Family history of malignant neoplasm of gastrointestinal tract 09/19/2020  . Fatty liver 09/19/2020  . Flatulence, eructation and gas pain 09/19/2020  . 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 Thyroid nodule 08/21/2019  . Pure hypercholesterolemia 07/07/2019  . Major depression, single episode 07/07/2019  . Herpes simplex 07/07/2019  . GERD (gastroesophageal reflux disease) 07/07/2019  . Anxiety 07/07/2019  . Overactive bladder 07/07/2019  . Postsurgical menopause 08/13/2009  . CHEST PAIN 11/19/2008  . COUGH 11/04/2008  . DEPRESSION 11/03/2008  . Allergic rhinitis 11/03/2008   Current Meds  Medication Sig  . azithromycin (ZITHROMAX Z-PAK) 250 MG tablet Take 1 tablet (250 mg total) by mouth daily. Take 2tabs on first day, then 1tab once a day till complete  . B COMPLEX VITAMINS PO Take by mouth.  . Calcium Carbonate (CALCIUM 500 PO) Take by mouth.  . cholecalciferol (VITAMIN D) 25 MCG (1000 UT) tablet Take 1,000 Units by mouth daily.  Marland Kitchen estradiol (ESTRACE) 1 MG tablet   . Zinc  Acetate, Oral, (ZINC ACETATE PO) Take by mouth.    Allergies: Patient is allergic to ciprofloxacin, codeine, doxycycline, iodine, penicillins, and sulfonamide derivatives. Family History: Patient family history includes Cancer in her father and sister; Colon polyps in her sister; Diabetes in her father; Glaucoma in her mother; Hyperlipidemia in her mother; Hypertension in her father and mother. Social History:  Patient  reports that she has never smoked. She has never used smokeless tobacco. She reports current alcohol use. She reports that she does not use drugs.  Review of Systems: Constitutional: Negative for fever  malaise or anorexia Cardiovascular: negative for chest pain Respiratory: negative for SOB or persistent cough Gastrointestinal: negative for abdominal pain  Objective  Vitals: BP 118/82   Pulse 77   Temp (!) 97.5 F (36.4 C) (Temporal)   Ht 5\' 8"  (1.727 m)   Wt 144 lb 12.8 oz (65.7 kg)   SpO2 98%   BMI 22.02 kg/m  General: no acute distress , A&Ox3 HEENT: PEERL, conjunctiva normal, neck is supple, TM most obstructed by cerumen, no cervical lymphadenopathy, supple neck Cardiovascular:  RRR without murmur or gallop.  Respiratory:  Good breath sounds bilaterally, CTAB with normal respiratory effort Skin:  Warm, no rashes     Commons side effects, risks, benefits, and alternatives for medications and treatment plan prescribed today were discussed, and the patient expressed understanding of the given instructions. Patient is instructed to call or message via MyChart if he/she has any questions or concerns regarding our treatment plan. No barriers to understanding were identified. We discussed Red Flag symptoms and signs in detail. Patient expressed understanding regarding what to do in case of urgent or emergency type symptoms.   Medication list was reconciled, printed and provided to the patient in AVS. Patient instructions and summary information was reviewed with the patient as documented in the AVS. This note was prepared with assistance of Dragon voice recognition software. Occasional wrong-word or sound-a-like substitutions may have occurred due to the inherent limitations of voice recognition software  This visit occurred during the SARS-CoV-2 public health emergency.  Safety protocols were in place, including screening questions prior to the visit, additional usage of staff PPE, and extensive cleaning of exam room while observing appropriate contact time as indicated for disinfecting solutions.

## 2020-09-19 NOTE — Patient Instructions (Addendum)
Please follow up if symptoms do not improve or as needed.   Continue the flonase and zyrtec.  Things should start to improve in the next week or two.

## 2020-09-20 ENCOUNTER — Telehealth: Payer: Self-pay | Admitting: Cardiovascular Disease

## 2020-09-20 NOTE — Telephone Encounter (Signed)
I received the following MyChart message via Patient schedule stating "I have been feeling a flutter in my left side of my chest  for about a week now and wanted to schedule an office visit to be checked out.I do not want a virtual visit."  At this time there is no sooner appointments available with Dr. Johnsie Cancel or the PA's other than the appointment she already has scheduled on 11/01/20 with Richardson Dopp. I am responding to the patient notifying her that this message is being sent and a nurse will be in contact with her to discuss her symptoms further. Please advise.

## 2020-09-20 NOTE — Telephone Encounter (Signed)
Spoke with pt and made her aware of information from Dr. Johnsie Cancel.  Pt appreciative for call.

## 2020-09-20 NOTE — Telephone Encounter (Signed)
Pt has been having fluttering feelings for about a week.  She was diagnosed with covid on 1/15.  Seen PCP yesterday and they listened to her heart and told her everything sounded fine and symptoms are likely related to covid.  HR at PCP was 77.  Pt hasn't been checking HR at home.  She did mention that she has a pulse ox.  Had her check vitals while on the phone: HR 79, O2 96-98%.  Occasionally has some slight lightheadedness that she calls a feeling of having waves in the back of her head.  Palps only last a few seconds and resolve.  Fluttering makes her feel very anxious.  Denies CP or SOB. Denies use of her Albuterol inhaler.  Did take Sudafed for a week and half when first diagnosed with covid and used Afrin x 2 days as well.  Advised I will send to Dr. Johnsie Cancel to see if he has any recommendations or feels she needs to be seen.

## 2020-09-20 NOTE — Telephone Encounter (Signed)
Don't think she needs to be seen palpitations sound benign

## 2020-10-03 ENCOUNTER — Ambulatory Visit: Payer: BC Managed Care – PPO | Admitting: Physician Assistant

## 2020-10-03 ENCOUNTER — Encounter: Payer: Self-pay | Admitting: Physician Assistant

## 2020-10-03 ENCOUNTER — Other Ambulatory Visit: Payer: Self-pay

## 2020-10-03 VITALS — BP 126/79 | HR 75 | Temp 98.0°F | Ht 68.0 in | Wt 148.0 lb

## 2020-10-03 DIAGNOSIS — G4452 New daily persistent headache (NDPH): Secondary | ICD-10-CM

## 2020-10-03 DIAGNOSIS — I498 Other specified cardiac arrhythmias: Secondary | ICD-10-CM | POA: Diagnosis not present

## 2020-10-03 DIAGNOSIS — R5383 Other fatigue: Secondary | ICD-10-CM

## 2020-10-03 DIAGNOSIS — H9313 Tinnitus, bilateral: Secondary | ICD-10-CM | POA: Diagnosis not present

## 2020-10-03 DIAGNOSIS — U099 Post covid-19 condition, unspecified: Secondary | ICD-10-CM

## 2020-10-03 DIAGNOSIS — R42 Dizziness and giddiness: Secondary | ICD-10-CM

## 2020-10-03 NOTE — Progress Notes (Signed)
Acute Office Visit  Subjective:    Patient ID: [name], female    DOB: 10/21/1965, 55 y.o.   MRN: [id number]  Chief Complaint  Patient presents with  . Dizziness    HPI Patient is in today for worsening tinnitus in Left ear. Woke up in the middle of the night feeling like a wave was going through her head and that she might pass out. She has also felt fluttering in her chest. Voice is hoarse today and she feels fatigued. Vision felt like it was hazy last night and she has seen some floaters in her eyes. Still feels a "big weight" pulling at the base of her skull and has a headache.  These symptoms have been waxing and waning since she tested positive for COVID 08/29/20. Not hospitalized then, just sinus symptoms. Pfizer 2 doses, no booster. Taste and smell are still gone. Still having brain fog.   Past Medical History:  Diagnosis Date  . Allergy   . GERD (gastroesophageal reflux disease)   . History of chickenpox   . Hyperlipidemia     Past Surgical History:  Procedure Laterality Date  . ABDOMINAL HYSTERECTOMY    . BLADDER SUSPENSION    . CHOLECYSTECTOMY      Family History  Problem Relation Age of Onset  . Hyperlipidemia Mother   . Hypertension Mother   . Glaucoma Mother   . Diabetes Father   . Hypertension Father   . Cancer Father        Melanoma  . Colon polyps Sister   . Cancer Sister        Skin    Social History   Socioeconomic History  . Marital status: Married    Spouse name: Not on file  . Number of children: Not on file  . Years of education: Not on file  . Highest education level: Not on file  Occupational History  . Not on file  Tobacco Use  . Smoking status: Never Smoker  . Smokeless tobacco: Never Used  Vaping Use  . Vaping Use: Never used  Substance and Sexual Activity  . Alcohol use: Yes    Comment: social  . Drug use: Never  . Sexual activity: Yes    Birth control/protection: Surgical  Other Topics Concern  . Not on  file  Social History Narrative  . Not on file   Social Determinants of Health   Financial Resource Strain: Not on file  Food Insecurity: Not on file  Transportation Needs: Not on file  Physical Activity: Not on file  Stress: Not on file  Social Connections: Not on file  Intimate Partner Violence: Not on file    Outpatient Medications Prior to Visit  Medication Sig Dispense Refill  . B COMPLEX VITAMINS PO Take by mouth.    . Calcium Carbonate (CALCIUM 500 PO) Take by mouth.    . cholecalciferol (VITAMIN D) 25 MCG (1000 UT) tablet Take 1,000 Units by mouth daily.    Marland Kitchen estradiol (ESTRACE) 1 MG tablet     . Zinc Acetate, Oral, (ZINC ACETATE PO) Take by mouth.    Marland Kitchen albuterol (VENTOLIN HFA) 108 (90 Base) MCG/ACT inhaler Inhale 1-2 puffs into the lungs every 6 (six) hours as needed for wheezing or shortness of breath. (Patient not taking: Reported on 09/19/2020) 8 g 0  . azithromycin (ZITHROMAX Z-PAK) 250 MG tablet Take 1 tablet (250 mg total) by mouth daily. Take 2tabs on first day, then 1tab once a day till  complete 6 tablet 0  . promethazine-dextromethorphan (PROMETHAZINE-DM) 6.25-15 MG/5ML syrup Take 5 mLs by mouth 3 (three) times daily as needed for cough. (Patient not taking: Reported on 10/03/2020) 180 mL 0  . rosuvastatin (CRESTOR) 20 MG tablet Take 1 tablet by mouth every evening. (Patient not taking: Reported on 09/19/2020)     No facility-administered medications prior to visit.    Allergies  Allergen Reactions  . Ciprofloxacin Hives and Nausea And Vomiting  . Codeine   . Doxycycline Itching  . Iodine   . Penicillins     REACTION: hives  . Sulfonamide Derivatives     REACTION: rash    Review of Systems REFER TO HPI FOR PERTINENT POSITIVES AND NEGATIVES     Objective:    Physical Exam Vitals and nursing note reviewed.  Constitutional:      Appearance: Normal appearance. She is normal weight. She is not toxic-appearing.  HENT:     Head: Normocephalic and atraumatic.      Right Ear: Tympanic membrane, ear canal and external ear normal.     Left Ear: Tympanic membrane, ear canal and external ear normal.     Nose: Nose normal.     Mouth/Throat:     Mouth: Mucous membranes are moist.  Eyes:     Extraocular Movements: Extraocular movements intact.     Conjunctiva/sclera: Conjunctivae normal.     Pupils: Pupils are equal, round, and reactive to light.  Cardiovascular:     Rate and Rhythm: Normal rate and regular rhythm.     Pulses: Normal pulses.     Heart sounds: Normal heart sounds.  Pulmonary:     Effort: Pulmonary effort is normal.     Breath sounds: Normal breath sounds.  Abdominal:     General: Abdomen is flat. Bowel sounds are normal.     Palpations: Abdomen is soft.  Musculoskeletal:        General: Normal range of motion.     Cervical back: Normal range of motion and neck supple.  Skin:    General: Skin is warm and dry.  Neurological:     General: No focal deficit present.     Mental Status: She is alert and oriented to person, place, and time.     Cranial Nerves: Cranial nerves are intact.     Sensory: Sensation is intact.     Motor: Motor function is intact.     Coordination: Romberg sign negative. Coordination normal. Finger-Nose-Finger Test and Heel to [facility] Test normal.     Gait: Gait is intact.  Psychiatric:        Mood and Affect: Mood normal.        Behavior: Behavior normal.        Thought Content: Thought content normal.        Judgment: Judgment normal.     BP 126/79   Pulse 75   Temp 98 F (36.7 C)   Ht 5\' 8"  (1.727 m)   Wt 148 lb (67.1 kg)   BMI 22.50 kg/m  Wt Readings from Last 3 Encounters:  10/03/20 148 lb (67.1 kg)  09/19/20 144 lb 12.8 oz (65.7 kg)  09/03/20 140 lb (63.5 kg)    Health Maintenance Due  Topic Date Due  . Hepatitis C Screening  Never done  . DTAP VACCINES (1) Never done  . HIV Screening  Never done  . DTaP/Tdap/Td (1 - Tdap) Never done  . TETANUS/TDAP  Never done  . PAP SMEAR-Modifier   01/11/2018  Topic Date Due  . DTAP VACCINES (1) Never done     Lab Results  Component Value Date   TSH 1.31 10/26/2019   TSH 1.31 10/26/2019   Lab Results  Component Value Date   WBC 5.7 10/26/2019   HGB 14.7 10/26/2019   HCT 43 10/26/2019   MCV 91.1 07/07/2019   PLT 301 10/26/2019   Lab Results  Component Value Date   NA 143 10/26/2019   K 4.1 10/26/2019   CO2 24 (A) 10/26/2019   GLUCOSE 87 07/07/2019   BUN 13 10/26/2019   CREATININE 1.0 10/26/2019   BILITOT 1.3 (H) 03/14/2020   ALKPHOS 69 03/14/2020   AST 16 03/14/2020   ALT 15 03/14/2020   PROT 7.2 03/14/2020   ALBUMIN 4.3 03/14/2020   CALCIUM 9.7 10/26/2019   ANIONGAP 7 09/22/2018   Lab Results  Component Value Date   CHOL 192 10/26/2019   CHOL 192 10/26/2019   Lab Results  Component Value Date   HDL 69 10/26/2019   HDL 69 10/26/2019   Lab Results  Component Value Date   LDLCALC 90 10/26/2019   Lab Results  Component Value Date   TRIG 198 (A) 10/26/2019   TRIG 198 (A) 10/26/2019   Lab Results  Component Value Date   CHOLHDL 2.7 07/07/2019   Lab Results  Component Value Date   HGBA1C 5.5 10/26/2019   HGBA1C 5.5 10/26/2019       Assessment & Plan:   Problem List Items Addressed This Visit   None   Visit Diagnoses    COVID-19 long hauler    -  Primary   Relevant Orders   EKG 12-Lead (Completed)   Periodic heart flutter       Relevant Orders   EKG 12-Lead (Completed)   Dizziness       Tinnitus of both ears       New daily persistent headache       Other fatigue         1. COVID-19 long hauler 2. Periodic heart flutter 3. Dizziness 4. Tinnitus of both ears 5. New daily persistent headache 6. Other fatigue Ongoing symptoms s/p COVID-19. No red flags on her exam today. Neuro exam is normal. Her EKG is showing NSR with rate of 71 bpm. She does have an appointment with cardiology in a few weeks. She has also seen ENT for the tinnitus and I reviewed that note. It is believed  that her symptoms will resolve with time. I agree with referral to neurology due to persistent headaches, dizzy feeling, and fatigue s/p COVID. They may want to consider brain MRI; however, will get their opinion on that as well. Should symptoms suddenly worsen or change, she will go straight to the ED.    This visit occurred during the SARS-CoV-2 public health emergency.  Safety protocols were in place, including screening questions prior to the visit, additional usage of staff PPE, and extensive cleaning of exam room while observing appropriate contact time as indicated for disinfecting solutions.    Tangala Wiegert M Toran Murch, PA-C

## 2020-10-03 NOTE — Patient Instructions (Signed)
I am going to try to get you in with a neurologist right away for post-covid neuro symptoms you are experiencing. They will call you for the appointment.   Go straight to the ER if any sudden "worst headache of life", chest pain, shortness of breath, severe acute weakness, dizziness, or other sudden concerning symptoms.    Tinnitus Tinnitus refers to hearing a sound when there is no actual source for that sound. This is often described as ringing in the ears. However, people with this condition may hear a variety of noises, in one ear or in both ears. The sounds of tinnitus can be soft, loud, or somewhere in between. Tinnitus can last for a few seconds or can be constant for days. It may go away without treatment and come back at various times. When tinnitus is constant or happens often, it can lead to other problems, such as trouble sleeping and trouble concentrating. Almost everyone experiences tinnitus at some point. Tinnitus that is long-lasting (chronic) or comes back often (recurs) may require medical attention. What are the causes? The cause of tinnitus is often not known. In some cases, it can result from:  Exposure to loud noises from machinery, music, or other sources.  An object (foreign body) stuck in the ear.  Earwax buildup.  Drinking alcohol or caffeine.  Taking certain medicines.  Age-related hearing loss. It may also be caused by medical conditions such as:  Ear or sinus infections.  High blood pressure.  Heart diseases.  Anemia.  Allergies.  Meniere's disease.  Thyroid problems.  Tumors.  A weak, bulging blood vessel (aneurysm) near the ear. What are the signs or symptoms? The main symptom of tinnitus is hearing a sound when there is no source for that sound. It may sound like:  Buzzing.  Roaring.  Ringing.  Blowing air.  Hissing.  Whistling.  Sizzling.  Humming.  Running water.  A musical note.  Tapping. Symptoms may affect only  one ear (unilateral) or both ears (bilateral). How is this diagnosed? Tinnitus is diagnosed based on your symptoms, your medical history, and a physical exam. Your health care provider may do a thorough hearing test (audiologic exam) if your tinnitus:  Is unilateral.  Causes hearing difficulties.  Lasts 6 months or longer. You may work with a health care provider who specializes in hearing disorders (audiologist). You may be asked questions about your symptoms and how they affect your daily life. You may have other tests done, such as:  CT scan.  MRI.  An imaging test of how blood flows through your blood vessels (angiogram). How is this treated? Treating an underlying medical condition can sometimes make tinnitus go away. If your tinnitus continues, other treatments may include:  Medicines.  Therapy and counseling to help you manage the stress of living with tinnitus.  Sound generators to mask the tinnitus. These include: ? Tabletop sound machines that play relaxing sounds to help you fall asleep. ? Wearable devices that fit in your ear and play sounds or music. ? Acoustic neural stimulation. This involves using headphones to listen to music that contains an auditory signal. Over time, listening to this signal may change some pathways in your brain and make you less sensitive to tinnitus. This treatment is used for very severe cases when no other treatment is working.  Using hearing aids or cochlear implants if your tinnitus is related to hearing loss. Hearing aids are worn in the outer ear. Cochlear implants are surgically placed in the  inner ear. Follow these instructions at home: Managing symptoms  When possible, avoid being in loud places and being exposed to loud sounds.  Wear hearing protection, such as earplugs, when you are exposed to loud noises.  Use a white noise machine, a humidifier, or other devices to mask the sound of tinnitus.  Practice techniques for reducing  stress, such as meditation, yoga, or deep breathing. Work with your health care provider if you need help with managing stress.  Sleep with your head slightly raised. This may reduce the impact of tinnitus.      General instructions  Do not use stimulants, such as nicotine, alcohol, or caffeine. Talk with your health care provider about other stimulants to avoid. Stimulants are substances that can make you feel alert and attentive by increasing certain activities in the body (such as heart rate and blood pressure). These substances may make tinnitus worse.  Take over-the-counter and prescription medicines only as told by your health care provider.  Try to get plenty of sleep each night.  Keep all follow-up visits as told by your health care provider. This is important. Contact a health care provider if:  Your tinnitus continues for 3 weeks or longer without stopping.  You develop sudden hearing loss.  Your symptoms get worse or do not get better with home care.  You feel you are not able to manage the stress of living with tinnitus. Get help right away if:  You develop tinnitus after a head injury.  You have tinnitus along with any of the following: ? Dizziness. ? Loss of balance. ? Nausea and vomiting. ? Sudden, severe headache. These symptoms may represent a serious problem that is an emergency. Do not wait to see if the symptoms will go away. Get medical help right away. Call your local emergency services (911 in the [place] Do not drive yourself to the hospital. Summary  Tinnitus refers to hearing a sound when there is no actual source for that sound. This is often described as ringing in the ears.  Symptoms may affect only one ear (unilateral) or both ears (bilateral).  Use a white noise machine, a humidifier, or other devices to mask the sound of tinnitus.  Do not use stimulants, such as nicotine, alcohol, or caffeine. Talk with your health care provider about other  stimulants to avoid. These substances may make tinnitus worse. This information is not intended to replace advice given to you by your health care provider. Make sure you discuss any questions you have with your health care provider. Document Revised: 02/10/2019 Document Reviewed: 05/09/2017 Elsevier Patient Education  2021 [employer].

## 2020-10-13 ENCOUNTER — Encounter: Payer: Self-pay | Admitting: Neurology

## 2020-10-13 ENCOUNTER — Ambulatory Visit: Payer: BC Managed Care – PPO | Admitting: Neurology

## 2020-10-13 VITALS — BP 96/66 | HR 81 | Ht 69.0 in | Wt 146.0 lb

## 2020-10-13 DIAGNOSIS — G933 Postviral fatigue syndrome: Secondary | ICD-10-CM

## 2020-10-13 DIAGNOSIS — I10 Essential (primary) hypertension: Secondary | ICD-10-CM

## 2020-10-13 DIAGNOSIS — R490 Dysphonia: Secondary | ICD-10-CM

## 2020-10-13 DIAGNOSIS — R519 Headache, unspecified: Secondary | ICD-10-CM | POA: Diagnosis not present

## 2020-10-13 DIAGNOSIS — R002 Palpitations: Secondary | ICD-10-CM | POA: Diagnosis not present

## 2020-10-13 DIAGNOSIS — G44221 Chronic tension-type headache, intractable: Secondary | ICD-10-CM

## 2020-10-13 DIAGNOSIS — U099 Post covid-19 condition, unspecified: Secondary | ICD-10-CM

## 2020-10-13 DIAGNOSIS — G9331 Postviral fatigue syndrome: Secondary | ICD-10-CM

## 2020-10-13 MED ORDER — CYCLOBENZAPRINE HCL 10 MG PO TABS: 10.0000 mg | ORAL_TABLET | Freq: Every day | ORAL | 3 refills | Status: DC

## 2020-10-13 MED ORDER — GUAIFENESIN ER 600 MG PO TB12: 600.0000 mg | ORAL_TABLET | Freq: Two times a day (BID) | ORAL | Status: DC

## 2020-10-13 NOTE — Patient Instructions (Signed)
Loss of smell, also known as ANOSMIA, is one of the most common symptoms of COVID-19.   The [facility] Professor of Otolaryngology Daphane Shepherd, MD, and Assistant Professor Annitta Jersey, MD, have investigated several treatments for persistent anosmia (loss of smell), with a special interest in viral-related anosmia.     As the number of total, confirmed COVID-19 cases increases so does the number of people suffering from disease-related anosmia, making anosmia a significant public health problem.     Rehabilitation of anosmia :   Smell daily one sample of the following categories; and look at a picture of the object - f. Example at a picture of a lemon while smelling a lemon oil.   A floral scent  - such as rose, lavender, or vanilla - all would qualify for these.  A spice scent - nutmeg, anise, coffee, cinnamon-  A citrus smell - lemon, lime, orange  A menthol or minty scent, or eucalyptus.   Goal is to re-associate scent ( and eventually taste ) to the image.  The success is gradual, and this form of rehabilitation may take 12 month to succeed.   Another complication is PAROSMIA - smelling something that is not present, often an unpleasant smell of burning rubber or spoiled , foul smells.   This can be reduced by using a carbamazepine at 100 mg po bid. This antiepileptic medication slows the nerve conduction and allows the reduction in abnormal smell sensation.    Larey Seat, MD      Copies: [facility]U article about treatment of ANOSMIA and Ageusia: If the combination of smell and vision of the same object that you're smelling improves the recovery of smell, then just smell alone. So we call that bimodal, visual and olfactory stimulation. So the standard olfactory training is four scents: rose, lemon, cloves and eucalyptus.  And the subject sniffs twice a day, 10, 12, 20 seconds each time the four essential oils for anywhere to six, eight or 12 weeks.  And olfactory  training for anosmia has been around for maybe 10, 15 years, studies suggest it works.  There's nothing absolutely definitive- but it can't hurt. It's thought to work by retraining the brain, the process of neuroplasticity, retraining the brain to smell these odors again. And we think that by smelling, you can retrain the brain. Now, one of the outstanding questions is, are you retraining the brain to smell rose, lemon, cloves and eucalyptus better, but forget about coffee, vanilla, coconut?  How generalizable is the improvement in smell? Clair Gulling, we asked the patients what smells would you like to pick and train on? The answer was fascinating to [place]. I thought it might be coffee as a coffee lover. I thought it might be vanilla as a vanilla-bean ice cream lover. It was smoke. The number one odor, smell, if you will, that people wanted to train on and to get better was smoke for the reasons that we talked about before.  The sense of smell is first and foremost attached to safety. And these people felt that if they could smell smoke better, they would feel more safe.  Unfortunately, there isn't a smoke essential oil so we couldn't do that.  But it just reinforced to [place] how important it is for patients to pick what smells they want to train on.  The other unique aspect that should be brought up is that half of the group are looking at high-quality pictures of the item, one of four items they  picked, and each one of them have high-quality photographs presented on their iPhone or iPad at the same time that they smell.

## 2020-10-13 NOTE — Progress Notes (Signed)
Provider:  Larey Seat, M D  Referring Provider: Delorse Limber Primary Care Physician:  Marin Olp, MD  Chief Complaint  Patient presents with  . New Patient (Initial Visit)    Pt alone, rm 11. Presents today for January 2022 infection with COVID- post covid symptoms :She describes brain fog and slow responses, ringing of the ears (which ENT eval) c/o of pain and head feels heavy posterior head between the ears.  The tension also goes into the neck and shoulders. She has not had imaging done.    HPI:  [name] is a 55 y.o. female patient and  seen here upon referral from [place].  [name] felt overall healthy and well until she contracted COVID on January 11 at a funeral. There were several close friends attending, there was at least one family member that was Covid positive among the other parties but the relatives did not attend the funeral with masks. Several people seem to have been affected during visitation earlier that week and [name] had symptoms with an 3 to 5 days after the funeral. She tested positive for the infection as her main symptoms were sinus pressure, she did receive an antibiotic which helped with clearing some of that up but she developed tinnitus tinnitus, headache and neck stiffness and she lost taste and smell which is not that common with the omicron -variant. Some of the other guests had coughing, fatigue, chills. Her main concern is brain fog - and loss of smell. She may have contracted delta?  She is lightheaded, yet feels she is all slowed.   Todays concern is for memory and smell loss-  She can smell lysol, which she sprays generously everywhere.  She had smelled things that weren't there , smoke and excrements-  Lost taste for citrus.   Lavender- " menthol" - finally got it right. Coconut- yes.  Almond- yes Vanilla - yes peppermint- yes [place]- 'Orange' . Clove- no- "cinnamon" -nutmeg-  unpleasant to her.     Review of Systems: Out of a complete 14 system review, the patient complains of only the following symptoms, and all other reviewed systems are negative.  reports palpitations at night Parosmia hoarseness- may be from sinus , may be from lysol spray. Orthostatics.   Social History   Socioeconomic History  . Marital status: Married    Spouse name: Not on file  . Number of children: Not on file  . Years of education: Not on file  . Highest education level: Not on file  Occupational History  . Not on file  Tobacco Use  . Smoking status: Never Smoker  . Smokeless tobacco: Never Used  Vaping Use  . Vaping Use: Never used  Substance and Sexual Activity  . Alcohol use: Yes    Comment: social  . Drug use: Never  . Sexual activity: Yes    Birth control/protection: Surgical  Other Topics Concern  . Not on file  Social History Narrative  . Not on file   Social Determinants of Health   Financial Resource Strain: Not on file  Food Insecurity: Not on file  Transportation Needs: Not on file  Physical Activity: Not on file  Stress: Not on file  Social Connections: Not on file  Intimate Partner Violence: Not on file    Family History  Problem Relation Age of Onset  . Hyperlipidemia Mother   . Hypertension Mother   . Glaucoma Mother   .  Diabetes Father   . Hypertension Father   . Cancer Father        Melanoma  . Colon polyps Sister   . Cancer Sister        Skin    Past Medical History:  Diagnosis Date  . Allergy   . GERD (gastroesophageal reflux disease)   . History of chickenpox   . Hyperlipidemia     Past Surgical History:  Procedure Laterality Date  . ABDOMINAL HYSTERECTOMY    . BLADDER SUSPENSION    . CHOLECYSTECTOMY      Current Outpatient Medications  Medication Sig Dispense Refill  . B COMPLEX VITAMINS PO Take by mouth.    . Calcium Carbonate (CALCIUM 500 PO) Take by mouth.    . cholecalciferol (VITAMIN D) 25 MCG (1000 UT)  tablet Take 1,000 Units by mouth daily.    Marland Kitchen estradiol (ESTRACE) 1 MG tablet     . Quercetin 250 MG TABS Take 1 tablet by mouth daily.    . Zinc Acetate, Oral, (ZINC ACETATE PO) Take by mouth.     No current facility-administered medications for this visit.    Allergies as of 10/13/2020 - Review Complete 10/13/2020  Allergen Reaction Noted  . Ciprofloxacin Hives and Nausea And Vomiting 09/22/2018  . Codeine  07/07/2019  . Doxycycline Itching 08/15/2015  . Iodine  07/07/2019  . Penicillins    . Sulfonamide derivatives      Vitals: BP 96/66 (BP Location: Left Arm, Patient Position: Standing)   Pulse 81   Ht 5\' 9"  (1.753 m)   Wt 146 lb (66.2 kg)   BMI 21.56 kg/m    Orthostatic VS for the past 72 hrs (Last 3 readings):  Patient Position BP Location  10/13/20 1000 Standing Left Arm  10/13/20 0959 Sitting Left Arm  10/13/20 0953 Supine Left Arm    Last Weight:  Wt Readings from Last 1 Encounters:  10/13/20 146 lb (66.2 kg)   Last Height:   Ht Readings from Last 1 Encounters:  10/13/20 5\' 9"  (1.753 m)      Physical exam:  General: The patient is awake, alert and appears fatigued, headache distress. The patient is well groomed. Head: Normocephalic, atraumatic. Neck is supple. Mallampati , neck circumference: 15 Cardiovascular:  Regular rate and rhythm- reports palpitations at night- , without  murmurs or carotid bruit, and without distended neck veins. Respiratory: Lungs are clear to auscultation. Skin:  Without evidence of edema, or rash Trunk: BMI is 21.56 kg/m2.   Neurologic exam : The patient is awake and alert, oriented to place and time.  Memory subjective impaired-. There is a normal attention span & concentration ability. Speech is fluent without dysarthria, but with  dysphonia and subjective  aphasia. Mood and affect are depressed.   Montreal Cognitive Assessment  10/13/2020  Visuospatial/ Executive (0/5) 5  Naming (0/3) 3  Attention: Read list of digits  (0/2) 2  Attention: Read list of letters (0/1) 1  Attention: Serial 7 subtraction starting at 100 (0/3) 3  Language: Repeat phrase (0/2) 1  Language : Fluency (0/1) 1  Abstraction (0/2) 2  Delayed Recall (0/5) 3  Orientation (0/6) 6  Total 27    Cranial nerves: smell and taste impairment.  Pupils are equal and briskly reactive to light. Funduscopic exam without  evidence of pallor or edema. Extraocular movements  in vertical and horizontal planes intact and without nystagmus.  Visual fields by finger perimetry are intact. Hearing to finger rub intact.  Facial  sensation intact to fine touch. Facial motor strength is symmetric and tongue and uvula move midline. Tongue protrusion into either cheek is normal. Shoulder shrug is normal.   Motor exam:  has normal tone ,muscle bulk and symmetric  strength in all extremities. Sensory:  Fine touch, pinprick and vibration were tested in all extremities. Proprioception was normal. Coordination: Rapid alternating movements in the fingers/hands were normal. Finger-to-nose maneuver normal without evidence of ataxia, dysmetria or tremor. Gait and station: Patient walks without assistive device and is able unassisted to climb up to the exam table. Strength within normal limits. Stance is stable and normal. Tandem gait is unfragmented. Romberg testing is negative  Deep tendon reflexes: in the  upper and lower extremities are symmetric and intact. Babinski maneuver response is  downgoing.   Assessment:  After physical and neurologic examination, review of laboratory studies, imaging, neurophysiology testing and pre-existing records, assessment is that of :   Reports feeling of sinus pressure and headache. She has not taken steroids.   She is anxious and feels impaired cognitively, physically, has palpitations. EKG was normal.  Tension neck and occipital headaches and sinus headaches.    Plan:  Treatment plan and additional workup :  Stop all  aerosols. Hydrate , hydrate with electrolytes.  Anosmia training optional. Awaiting cardiac monitor. MRI of the brain- review for swelling, will also see sinus disease.  Steroid nasal spray- may need repeat Z pack. Tension neck and occipital headaches- will try flexeril at night  and sinus headaches may respond to sinus rinse- .  Slow increase in daily activity.   Asencion Partridge Karey Suthers MD 10/13/2020

## 2020-10-17 ENCOUNTER — Encounter: Payer: Self-pay | Admitting: Neurology

## 2020-10-17 ENCOUNTER — Telehealth: Payer: Self-pay | Admitting: Cardiovascular Disease

## 2020-10-17 ENCOUNTER — Other Ambulatory Visit: Payer: Self-pay | Admitting: Neurology

## 2020-10-17 ENCOUNTER — Telehealth: Payer: Self-pay | Admitting: Neurology

## 2020-10-17 LAB — SAR COV2 SEROLOGY (COVID19)AB(IGG),IA
SARS-CoV-2 Semi-Quant IgG Ab: >800 AU/mL (ref <13.0)
SARS-CoV-2 Spike Ab Interp: POSITIVE

## 2020-10-17 LAB — COMPREHENSIVE METABOLIC PANEL
ALT: 15 IU/L (ref 0–32)
AST: 19 IU/L (ref 0–40)
Albumin/Globulin Ratio: 1.5 (ref 1.2–2.2)
Albumin: 4.5 g/dL (ref 3.8–4.9)
Alkaline Phosphatase: 59 IU/L (ref 44–121)
BUN/Creatinine Ratio: 18 (ref 9–23)
BUN: 15 mg/dL (ref 6–24)
Bilirubin Total: 0.9 mg/dL (ref 0.0–1.2)
CO2: 26 mmol/L (ref 20–29)
Calcium: 10.1 mg/dL (ref 8.7–10.2)
Chloride: 100 mmol/L (ref 96–106)
Creatinine, Ser: 0.84 mg/dL (ref 0.57–1.00)
Globulin, Total: 3 g/dL (ref 1.5–4.5)
Glucose: 79 mg/dL (ref 65–99)
Potassium: 4.2 mmol/L (ref 3.5–5.2)
Sodium: 139 mmol/L (ref 134–144)
Total Protein: 7.5 g/dL (ref 6.0–8.5)
eGFR: 84 mL/min/{1.73_m2} (ref >59)

## 2020-10-17 LAB — SARS-COV-2 ANTIBODY, IGM

## 2020-10-17 MED ORDER — ALPRAZOLAM 0.5 MG PO TABS: 0.5000 mg | ORAL_TABLET | Freq: Once | ORAL | 0 refills | Status: DC | PRN

## 2020-10-17 NOTE — Telephone Encounter (Signed)
Pt in need of something to help with  Upcoming MRI procedure.

## 2020-10-17 NOTE — Telephone Encounter (Signed)
Called patient about her message. Patient complaining of chest palpitations and chest pressure when she sleeps on her side. Patient stated since she had COVID a couple of months ago, that she has been having a lot of issues with her health. Patient stated she saw her PCP last week and they did an EKG, they told her the EKG was fine and it should be fine to wait for her appointment on 11/01/20 to be evaluated. Patient stated at her neurology appointment that they suggested she might have a cardiac issue. Patient also complaining of vertigo, pain in neck and head, facial swelling, and hoarseness. Will forward to Dr. Tamala Julian and Dr. Marlou Porch who are covering Dr. Johnsie Cancel, for advisement.

## 2020-10-17 NOTE — Telephone Encounter (Signed)
Per MyChart appointment request message:  I am following up to see if there's any sooner appt than 3/22. I don't mind seeing a PA if it gets me in sooner (already scheduled with Richardson Dopp on 3/22). I am still having some fluttering and another feeling in my chest (left side) that's hard to describe. Last Thursday I had an appointment with a neurologist who is ordering an MRI. She also mentioned some of my symptoms seem to be cardiology but she definitely wanted to order an MRI with/without contrast.   I would like to get checked out for MIS-A/Myocarditis other tests/labs that may be necessary. It's difficult trying to get to sleep with the fluttering/palpitations and ringing. I've had some neck pain and pain in base of head that doesn't go away with massages. Vertigo feelings come & go.   I wake up feeling like my face and head are "full"/swollen feeling, under my eyes has been swollen looking recently and I've sounded hoarse for a few weeks now  I looked but could not find anything sooner for her to be seen.

## 2020-10-17 NOTE — Progress Notes (Signed)
Test was cancelled due to supply chain issues.

## 2020-10-17 NOTE — Telephone Encounter (Signed)
bcbs Emma pending fx notes to [facility] ph # [phone] & fax # [phone]

## 2020-10-18 NOTE — Telephone Encounter (Signed)
Keep appointment with Richardson Dopp.

## 2020-10-19 NOTE — Telephone Encounter (Signed)
I called [facility] and spoke with Izora Gala to get an update. She stated they received the fax of clinical notes. It is in nurse review and the turn around time is from 3 to 7 business days.

## 2020-10-19 NOTE — Telephone Encounter (Signed)
Called patient back with recommendations. Patient verbalized understanding and will keep appointment on 11/01/20 with Richardson Dopp PA.

## 2020-10-24 NOTE — Telephone Encounter (Signed)
no to the covid questions MR Brain w/wo contrast Dr. Brett Fairy BCBS Josem Kaufmann through [employer]: [id number] (exp. 10/26/20). Patient is scheduled at [facility] for 10/26/20.  [facility] ph # [phone] & fax # [phone].

## 2020-10-26 ENCOUNTER — Ambulatory Visit: Payer: BC Managed Care – PPO

## 2020-10-26 DIAGNOSIS — R519 Headache, unspecified: Secondary | ICD-10-CM | POA: Diagnosis not present

## 2020-10-26 DIAGNOSIS — G933 Postviral fatigue syndrome: Secondary | ICD-10-CM

## 2020-10-26 DIAGNOSIS — G9331 Postviral fatigue syndrome: Secondary | ICD-10-CM

## 2020-10-26 DIAGNOSIS — I10 Essential (primary) hypertension: Secondary | ICD-10-CM

## 2020-10-26 DIAGNOSIS — G44221 Chronic tension-type headache, intractable: Secondary | ICD-10-CM

## 2020-10-26 DIAGNOSIS — R490 Dysphonia: Secondary | ICD-10-CM

## 2020-10-26 DIAGNOSIS — U099 Post covid-19 condition, unspecified: Secondary | ICD-10-CM

## 2020-10-26 DIAGNOSIS — R002 Palpitations: Secondary | ICD-10-CM

## 2020-10-26 MED ORDER — GADOBENATE DIMEGLUMINE 529 MG/ML IV SOLN
15.0000 mL | Freq: Once | INTRAVENOUS | Status: AC | PRN
  Administered 2020-10-26: 15 mL via INTRAVENOUS

## 2020-10-28 ENCOUNTER — Encounter: Payer: Self-pay | Admitting: Neurology

## 2020-10-28 NOTE — Progress Notes (Signed)
FINDINGS:   No abnormal lesions are seen on diffusion-weighted views to suggest acute ischemia. The cortical sulci, fissures and cisterns are normal in size and appearance. Lateral, third and fourth ventricle are normal in size and appearance. No extra-axial fluid collections are seen. No evidence of mass effect or midline shift.  No abnormal lesions are seen on post contrast views.    On sagittal views the posterior fossa, pituitary gland and corpus callosum are unremarkable. No evidence of intracranial hemorrhage on gradient-echo views. The orbits and their contents, paranasal sinuses and calvarium are unremarkable.  Intracranial flow voids are present.   IMPRESSION:   Normal MRI brain (with and without).     INTERPRETING PHYSICIAN:  Penni Bombard, MD

## 2020-10-31 NOTE — Progress Notes (Addendum)
Cardiology Office Note:    Date:  11/01/2020   ID:  [name], DOB 11/01/1965, MRN [id number]  PCP:  Marin Olp, [place]  Cardiologist:  Jenkins Rouge, MD   Advanced Practice Provider:  No care team member to display Electrophysiologist:  None       Referring MD: Brunetta Jeans, PA-C   Chief Complaint:  Palpitations and Shortness of Breath    Patient Profile:    [name] is a 55 y.o. female with:   Coronary calcification (CT 07/2019)  Hyperlipidemia  GERD  Diverticulosis   Diverticulitis in 09/2018   Prior CV studies: CT CARDIAC SCORING 07/17/2019 Addendum 07/17/2019  1:18 PM Ascending aorta: Normal diameter Pericardium: Normal Coronary arteries: One small area of calcium noted in mid LAD and proximal RCA IMPRESSION: Coronary calcium score of 3. This was 4 th percentile for age and sex matched control.   History of Present Illness:    [name] was last seen by Dr. Johnsie Cancel in 07/2019 for symptoms of non-cardiac chest pain. A CT Cardiac Scoring demonstrated a Ca2+ score of 3 placing her in the 84th percentile for her age and sex matched controls.  She was previously on statin Rx.  She contracted COVID-19 in 08/2020. She has had persistent symptoms c/w Long COVID.  She saw Dr. Brett Fairy recently and a brain MRI was normal.  Review of notes demonstrates orthostatic VS with BP lying 115/75 >> standing 96/66 (HR 77 >> 81).    She is here alone.  Since she developed Covid in January, she has had continued symptoms of congestion, lightheadedness and fatigue.  She has been to ENT and neurology.  She has episodes where she feels near syncopal with associated neck discomfort or "head rush".  She has palpitations associated with this.  She has had some rapid palpitations at times.  She has had some discomfort with certain positions in bed.  She feels more discomfort if she lays on her left side.  She has been  sleeping on an incline.  She has not had PND, leg edema.  She has not had frank syncope.  She does feel short of breath when she talks.     Past Medical History:  Diagnosis Date  . Allergy   . Coronary artery calcification    CT 07/2019: Ca score 3  . Diverticulosis    hx of diverticulitis   . GERD (gastroesophageal reflux disease)   . History of chickenpox   . Hyperlipidemia     Current Medications: Current Meds  Medication Sig  . B COMPLEX VITAMINS PO Take 1 tablet by mouth daily.  . Calcium Carbonate (CALCIUM 500 PO) Take 1 tablet by mouth daily.  . cholecalciferol (VITAMIN D) 25 MCG (1000 UT) tablet Take 1,000 Units by mouth daily.  Marland Kitchen estradiol (ESTRACE) 1 MG tablet Take 1 mg by mouth daily.  . Quercetin 250 MG TABS Take 1 tablet by mouth daily.  . Zinc Acetate, Oral, (ZINC ACETATE PO) Take 1 tablet by mouth daily.     Allergies:   Ciprofloxacin, Codeine, Doxycycline, Iodine, Penicillins, and Sulfonamide derivatives   Social History   Tobacco Use  . Smoking status: Never Smoker  . Smokeless tobacco: Never Used  Vaping Use  . Vaping Use: Never used  Substance Use Topics  . Alcohol use: Yes    Comment: social  . Drug use: Never     Family Hx: The patient's family history includes  Cancer in her father and sister; Colon polyps in her sister; Diabetes in her father; Glaucoma in her mother; Hyperlipidemia in her mother; Hypertension in her father and mother.  ROS   EKGs/Labs/Other Test Reviewed:    EKG:  EKG is   ordered today.  The ekg ordered today demonstrates normal sinus rhythm, heart rate 79, normal axis, no ST-T wave changes, QTC 449  Recent Labs: 10/13/2020: ALT 15; BUN 15; Creatinine, Ser 0.84; Potassium 4.2; Sodium 139   Recent Lipid Panel Lab Results  Component Value Date/Time   CHOL 192 10/26/2019 12:00 AM   CHOL 192 10/26/2019 12:00 AM   TRIG 198 (A) 10/26/2019 12:00 AM   TRIG 198 (A) 10/26/2019 12:00 AM   HDL 69 10/26/2019 12:00 AM   HDL 69  10/26/2019 12:00 AM   CHOLHDL 2.7 07/07/2019 03:01 PM   LDLCALC 90 10/26/2019 12:00 AM   LDLCALC 85 07/07/2019 03:01 PM      Risk Assessment/Calculations:      Physical Exam:    VS:  BP 112/68   Pulse 79   Ht 5\' 9"  (1.753 m)   Wt 149 lb 6.4 oz (67.8 kg)   SpO2 98%   BMI 22.06 kg/m     Wt Readings from Last 3 Encounters:  11/01/20 149 lb 6.4 oz (67.8 kg)  10/13/20 146 lb (66.2 kg)  10/03/20 148 lb (67.1 kg)     Constitutional:      Appearance: Healthy appearance. Not in distress.  Neck:     Thyroid: No thyromegaly.     Vascular: JVD normal.  Pulmonary:     Effort: Pulmonary effort is normal.     Breath sounds: No wheezing. No rales.  Cardiovascular:     Normal rate. Regular rhythm. Normal S1. Normal S2.     Murmurs: There is no murmur.  Edema:    Peripheral edema absent.  Abdominal:     Palpations: Abdomen is soft. There is no hepatomegaly.  Skin:    General: Skin is warm and dry.  Neurological:     Mental Status: Alert and oriented to person, place and time.     Cranial Nerves: Cranial nerves are intact.       ASSESSMENT & PLAN:    1. Shortness of breath 2. Other fatigue 3. Long COVID I suspect all of her symptoms are related to [place].  She does not really have pleuritic symptoms.  Her O2 sats are normal on RA.  She is in sinus rhythm on EKG today and there is no ST elevation.  We discussed the potential for cardiac injury or atrial fibrillation with COVID.  I have recommended that we proceed with an event monitor to rule out atrial fibrillation.  I have also recommended proceeding with an echocardiogram to rule out LV dysfunction.  I do not think she has pericarditis.  However, an echocardiogram will help rule out pericardial effusion.  I will also obtain a CBC and TSH today.  Follow-up in 2 months after testing is complete.  Continue follow-up with primary care, neurology and ENT as indicated.  4. Coronary artery calcification seen on CT scan She is not  really having anginal symptoms.  We will need to revisit cholesterol management at follow-up.  She is currently off of rosuvastatin.  Given her current symptoms related to recent COVID illness, I would not try to re-challenge with statin Rx until she is feeling better.        Dispo:  Return in about 8  weeks (around 12/27/2020) for Follow up after testing w Dr. Johnsie Cancel, or Richardson Dopp, PA-C, in person.   Medication Adjustments/Labs and Tests Ordered: Current medicines are reviewed at length with the patient today.  Concerns regarding medicines are outlined above.  Tests Ordered: Orders Placed This Encounter  Procedures  . TSH  . CBC  . Cardiac event monitor  . EKG 12-Lead  . ECHOCARDIOGRAM COMPLETE   Medication Changes: No orders of the defined types were placed in this encounter.   Signed, Richardson Dopp, PA-C  11/01/2020 4:46 PM    [facility] [address], [place], [place]  [address] Phone: [phone]; Fax: [phone]

## 2020-11-01 ENCOUNTER — Other Ambulatory Visit: Payer: Self-pay

## 2020-11-01 ENCOUNTER — Ambulatory Visit: Payer: BC Managed Care – PPO | Admitting: Physician Assistant

## 2020-11-01 ENCOUNTER — Encounter: Payer: Self-pay | Admitting: Physician Assistant

## 2020-11-01 ENCOUNTER — Encounter: Payer: Self-pay | Admitting: *Deleted

## 2020-11-01 VITALS — BP 112/68 | HR 79 | Ht 69.0 in | Wt 149.4 lb

## 2020-11-01 DIAGNOSIS — E7800 Pure hypercholesterolemia, unspecified: Secondary | ICD-10-CM

## 2020-11-01 DIAGNOSIS — R5383 Other fatigue: Secondary | ICD-10-CM

## 2020-11-01 DIAGNOSIS — R0602 Shortness of breath: Secondary | ICD-10-CM | POA: Diagnosis not present

## 2020-11-01 DIAGNOSIS — I2510 Atherosclerotic heart disease of native coronary artery without angina pectoris: Secondary | ICD-10-CM | POA: Diagnosis not present

## 2020-11-01 DIAGNOSIS — U099 Post covid-19 condition, unspecified: Secondary | ICD-10-CM

## 2020-11-01 LAB — CBC
Hematocrit: 44.9 % (ref 34.0–46.6)
Hemoglobin: 15.4 g/dL (ref 11.1–15.9)
MCH: 32.4 pg (ref 26.6–33.0)
MCHC: 34.3 g/dL (ref 31.5–35.7)
MCV: 94 fL (ref 79–97)
Platelets: 275 10*3/uL (ref 150–450)
RBC: 4.76 x10E6/uL (ref 3.77–5.28)
RDW: 12.4 % (ref 11.7–15.4)
WBC: 4.6 10*3/uL (ref 3.4–10.8)

## 2020-11-01 LAB — TSH: TSH: 1.04 u[IU]/mL (ref 0.450–4.500)

## 2020-11-01 NOTE — Patient Instructions (Signed)
Medication Instructions:  Your physician recommends that you continue on your current medications as directed. Please refer to the Current Medication list given to you today.  *If you need a refill on your cardiac medications before your next appointment, please call your pharmacy*   Lab Work: Your physician recommends that you yhave lab work today: TSH/CBC  If you have labs (blood work) drawn today and your tests are completely normal, you will receive your results only by: Marland Kitchen MyChart Message (if you have MyChart) OR . A paper copy in the mail If you have any lab test that is abnormal or we need to change your treatment, we will call you to review the results.   Testing/Procedures: Your physician has requested that you have an echocardiogram.Tuesday, May 9 @ 9:20 am. Echocardiography is a painless test that uses sound waves to create images of your heart. It provides your doctor with information about the size and shape of your heart and how well your heart's chambers and valves are working. This procedure takes approximately one hour. There are no restrictions for this procedure.   Echocardiogram An echocardiogram is a test that uses sound waves (ultrasound) to produce images of the heart. Images from an echocardiogram can provide important information about:  Heart size and shape.  The size and thickness and movement of your heart's walls.  Heart muscle function and strength.  Heart valve function or if you have stenosis. Stenosis is when the heart valves are too narrow.  If blood is flowing backward through the heart valves (regurgitation).  A tumor or infectious growth around the heart valves.  Areas of heart muscle that are not working well because of poor blood flow or injury from a heart attack.  Aneurysm detection. An aneurysm is a weak or damaged part of an artery wall. The wall bulges out from the normal force of blood pumping through the body. Tell a health care  provider about:  Any allergies you have.  All medicines you are taking, including vitamins, herbs, eye drops, creams, and over-the-counter medicines.  Any blood disorders you have.  Any surgeries you have had.  Any medical conditions you have.  Whether you are pregnant or may be pregnant. What are the risks? Generally, this is a safe test. However, problems may occur, including an allergic reaction to dye (contrast) that may be used during the test. What happens before the test? No specific preparation is needed. You may eat and drink normally. What happens during the test?  You will take off your clothes from the waist up and put on a hospital gown.  Electrodes or electrocardiogram (ECG)patches may be placed on your chest. The electrodes or patches are then connected to a device that monitors your heart rate and rhythm.  You will lie down on a table for an ultrasound exam. A gel will be applied to your chest to help sound waves pass through your skin.  A handheld device, called a transducer, will be pressed against your chest and moved over your heart. The transducer produces sound waves that travel to your heart and bounce back (or "echo" back) to the transducer. These sound waves will be captured in real-time and changed into images of your heart that can be viewed on a video monitor. The images will be recorded on a computer and reviewed by your health care provider.  You may be asked to change positions or hold your breath for a short time. This makes it easier to get  different views or better views of your heart.  In some cases, you may receive contrast through an IV in one of your veins. This can improve the quality of the pictures from your heart. The procedure may vary among health care providers and hospitals.   What can I expect after the test? You may return to your normal, everyday life, including diet, activities, and medicines, unless your health care provider tells you  not to do that. Follow these instructions at home:  It is up to you to get the results of your test. Ask your health care provider, or the department that is doing the test, when your results will be ready.  Keep all follow-up visits. This is important. Summary  An echocardiogram is a test that uses sound waves (ultrasound) to produce images of the heart.  Images from an echocardiogram can provide important information about the size and shape of your heart, heart muscle function, heart valve function, and other possible heart problems.  You do not need to do anything to prepare before this test. You may eat and drink normally.  After the echocardiogram is completed, you may return to your normal, everyday life, unless your health care provider tells you not to do that. This information is not intended to replace advice given to you by your health care provider. Make sure you discuss any questions you have with your health care provider. Document Revised: 03/22/2020 Document Reviewed: 03/22/2020 Elsevier Patient Education  2021 [place] Cardiac Event Monitor Instructions Your physician has requested you wear your cardiac event monitor for __30___ days, (1-30). [employer] may call or text to confirm a shipping address. The monitor will be sent to a land address via UPS. [employer] will not ship a monitor to a PO BOX. It typically takes 3-5 days to receive your monitor after it has been enrolled. [employer] will assist with USPS tracking if your package is delayed. The telephone number for [employer] is [phone]. Once you have received your monitor, please review the enclosed instructions. Instruction tutorials can also be viewed under help and settings on the enclosed cell phone. Your monitor has already been registered assigning a specific monitor serial # to you.  Applying the monitor Remove cell phone from case and turn it on. The cell phone works as Ship broker and needs to be within [employer] of you at all times. The cell phone will need to be charged on a daily basis. We recommend you plug the cell phone into the enclosed charger at your bedside table every night.  Monitor batteries: You will receive two monitor batteries labelled #1 and #2. These are your recorders. Plug battery #2 onto the second connection on the enclosed charger. Keep one battery on the charger at all times. This will keep the monitor battery deactivated. It will also keep it fully charged for when you need to switch your monitor batteries. A small light will be blinking on the battery emblem when it is charging. The light on the battery emblem will remain on when the battery is fully charged.  Open package of a Monitor strip. Insert battery #1 into black hood on strip and gently squeeze monitor battery onto connection as indicated in instruction booklet. Set aside while preparing skin.  Choose location for your strip, vertical or horizontal, as indicated in the instruction booklet. Shave to remove all hair from location. There cannot be any lotions, oils, powders, or colognes on skin where monitor is to be  applied. Wipe skin clean with enclosed Saline wipe. Dry skin completely.  Peel paper labeled #1 off the back of the Monitor strip exposing the adhesive. Place the monitor on the chest in the vertical or horizontal position shown in the instruction booklet. One arrow on the monitor strip must be pointing upward. Carefully remove paper labeled #2, attaching remainder of strip to your skin. Try not to create any folds or wrinkles in the strip as you apply it.  Firmly press and release the circle in the center of the monitor battery. You will hear a small beep. This is turning the monitor battery on. The heart emblem on the monitor battery will light up every 5 seconds if the monitor battery in turned on and connected to the patient securely. Do not push and hold the  circle down as this turns the monitor battery off. The cell phone will locate the monitor battery. A screen will appear on the cell phone checking the connection of your monitor strip. This may read poor connection initially but change to good connection within the next minute. Once your monitor accepts the connection you will hear a series of 3 beeps followed by a climbing crescendo of beeps. A screen will appear on the cell phone showing the two monitor strip placement options. Touch the picture that demonstrates where you applied the monitor strip.  Your monitor strip and battery are waterproof. You are able to shower, bathe, or swim with the monitor on. They just ask you do not submerge deeper than 3 feet underwater. We recommend removing the monitor if you are swimming in a lake, river, or ocean.  Your monitor battery will need to be switched to a fully charged monitor battery approximately once a week. The cell phone will alert you of an action which needs to be made.  On the cell phone, tap for details to reveal connection status, monitor battery status, and cell phone battery status. The green dots indicates your monitor is in good status. A red dot indicates there is something that needs your attention.  To record a symptom, click the circle on the monitor battery. In 30-60 seconds a list of symptoms will appear on the cell phone. Select your symptom and tap save. Your monitor will record a sustained or significant arrhythmia regardless of you clicking the button. Some patients do not feel the heart rhythm irregularities. [employer] will notify us of any serious or critical events.  Refer to instruction booklet for instructions on switching batteries, changing strips, the Do not disturb or Pause features, or any additional questions.  Call [employer] at [phone], to confirm your monitor is transmitting and record your baseline. They will answer any questions you may have  regarding the monitor instructions at that time.  Returning the monitor to [place] all equipment back into blue box. Peel off strip of paper to expose adhesive and close box securely. There is a prepaid UPS shipping label on this box. Drop in a UPS drop box, or at a UPS facility like [employer]. You may also contact [employer] to arrange UPS to pick up monitor package at your home.   Follow-Up: At [facility], you and your health needs are our priority.  As part of our continuing mission to provide you with exceptional heart care, we have created designated Provider Care Teams.  These Care Teams include your primary Cardiologist (physician) and Advanced Practice Providers (APPs -  Physician Assistants and Nurse Practitioners) who all work together to provide  you with the care you need, when you need it.  We recommend signing up for the patient portal called "MyChart".  Sign up information is provided on this After Visit Summary.  MyChart is used to connect with patients for Virtual Visits (Telemedicine).  Patients are able to view lab/test results, encounter notes, upcoming appointments, etc.  Non-urgent messages can be sent to your provider as well.   To learn more about what you can do with MyChart, go to [email or web address].    Your next appointment:   8 week(s)  The format for your next appointment:   In Person  Provider:   You will see one of the following Advanced Practice Providers on your designated Care Team:    Richardson Dopp, [place]    Other Instructions

## 2020-11-01 NOTE — Progress Notes (Signed)
Patient ID: [name], female   DOB: February 20, 1966, 55 y.o.   MRN: [id number] Patient enrolled for [employer] to ship a 30 day cardiac event monitor to her home.

## 2020-11-08 ENCOUNTER — Encounter: Payer: Self-pay | Admitting: Family Medicine

## 2020-11-08 ENCOUNTER — Ambulatory Visit (INDEPENDENT_AMBULATORY_CARE_PROVIDER_SITE_OTHER): Payer: BC Managed Care – PPO

## 2020-11-08 DIAGNOSIS — I2510 Atherosclerotic heart disease of native coronary artery without angina pectoris: Secondary | ICD-10-CM

## 2020-11-08 DIAGNOSIS — R5383 Other fatigue: Secondary | ICD-10-CM

## 2020-11-08 DIAGNOSIS — R42 Dizziness and giddiness: Secondary | ICD-10-CM | POA: Diagnosis not present

## 2020-11-08 DIAGNOSIS — R002 Palpitations: Secondary | ICD-10-CM | POA: Diagnosis not present

## 2020-11-08 DIAGNOSIS — U099 Post covid-19 condition, unspecified: Secondary | ICD-10-CM

## 2020-11-08 DIAGNOSIS — R0602 Shortness of breath: Secondary | ICD-10-CM

## 2020-11-08 MED ORDER — SCOPOLAMINE 1 MG/3DAYS TD PT72: 1.0000 | MEDICATED_PATCH | TRANSDERMAL | 0 refills | Status: DC

## 2020-11-21 ENCOUNTER — Telehealth: Payer: Self-pay | Admitting: Neurology

## 2020-11-21 MED ORDER — TOPIRAMATE 25 MG PO TABS: 25.0000 mg | ORAL_TABLET | Freq: Two times a day (BID) | ORAL | 3 refills | Status: DC

## 2020-11-21 NOTE — Telephone Encounter (Signed)
Per Neurology note in epic- Dr. Edwena Felty office has been in contact with pt and advised to go to ER or UC if any further symptoms.  They will discuss with Dr. Brett Fairy and reach back out to pt today or tomorrow.

## 2020-11-21 NOTE — Telephone Encounter (Signed)
Dizzy, HA and LOC.  I suggest to start topiramate as a HA preventer and as a seizure medication which doesn't affect her heart rate.  25 mg bid po. - I will put in an order.  Please call her back. CD

## 2020-11-21 NOTE — Telephone Encounter (Signed)
Please call the patient ASAP to get an update on her symptoms. If she has any continued symptoms, she should call 911 and go to the ED.  Please check with monitoring company to see if we have received any critical results from her monitor today.  Richardson Dopp, PA-C    11/21/2020 1:25 PM

## 2020-11-21 NOTE — Telephone Encounter (Signed)
LVM relaying Dr. Edwena Felty recommendation. Advised rx called into her pharmacy. Explained directions. Advised her to call back if she has any more questions/concerns.

## 2020-11-21 NOTE — Telephone Encounter (Signed)
Pt called, I went to the walk in clinic, clinic prescribed me a  medication and Dr. Brett Fairy has also prescribed a medication. Would like a call from the nurse for clarification on which medication to take.

## 2020-11-21 NOTE — Telephone Encounter (Signed)
Patient calling and asking if she can come to see Dr. Brett Fairy  today .   Patient said she is dizzy bad pounding  at the back of her head . She was talking to her son and she just went blank for a second .   Patient is wearing heart monitor and just got off a flight  less than 24 hour a go.

## 2020-11-21 NOTE — Telephone Encounter (Addendum)
Called pt back. She went to [facility] walk in clinic. Called in imipramine 25mg  po BID. Wants to know if Dr. Brett Fairy recommends this or topamax. I placed her on hold and spoke with MD. Per MD, she can try imipramine for a week, if effective, she can stay on this. If ineffective, can try topamax instead. Pt verbalized understanding. She had labs drawn. She will have them send copy of results to our office at [phone] to Dr. Edwena Felty attention for review.

## 2020-11-21 NOTE — Telephone Encounter (Signed)
Called pt back to further discuss. She has already contacted cardiologist who states heart monitor showed no events, everything looked normal. Advised her to follow up with Neurologist. Advised Dr. Brett Fairy does not have any openings today. She should proceed to urgent care or ED for further evaluation if sx severe or worsen. Aware I will send message to Dr. Brett Fairy and we will follow up with her today or tomorrow.  She got off the flight this past Saturday. She made sure she was well hydrated prior to flight. Sx were mild prior but now worse. She saw eye doctor since last seeing Dr. Brett Fairy and when they shined a light into her eyes, she had pain in the back of her eyes. Her vision also changed, getting new glasses this Wednesday.

## 2020-11-21 NOTE — Addendum Note (Signed)
Addended by: Larey Seat on: 11/21/2020 04:34 PM   Modules accepted: Orders

## 2020-11-22 ENCOUNTER — Encounter: Payer: Self-pay | Admitting: Neurology

## 2020-12-03 NOTE — Telephone Encounter (Signed)
Please contact patient regarding the end date of her monitor. Thanks, Richardson Dopp, PA-C    12/03/2020 9:18 PM

## 2020-12-09 ENCOUNTER — Other Ambulatory Visit: Payer: Self-pay | Admitting: Physician Assistant

## 2020-12-09 DIAGNOSIS — R0602 Shortness of breath: Secondary | ICD-10-CM

## 2020-12-09 DIAGNOSIS — R42 Dizziness and giddiness: Secondary | ICD-10-CM

## 2020-12-09 DIAGNOSIS — R5383 Other fatigue: Secondary | ICD-10-CM

## 2020-12-09 DIAGNOSIS — I2510 Atherosclerotic heart disease of native coronary artery without angina pectoris: Secondary | ICD-10-CM

## 2020-12-09 DIAGNOSIS — U099 Post covid-19 condition, unspecified: Secondary | ICD-10-CM

## 2020-12-09 DIAGNOSIS — R002 Palpitations: Secondary | ICD-10-CM

## 2020-12-11 DIAGNOSIS — Z9289 Personal history of other medical treatment: Secondary | ICD-10-CM

## 2020-12-11 HISTORY — DX: Personal history of other medical treatment: Z92.89

## 2020-12-19 ENCOUNTER — Other Ambulatory Visit: Payer: Self-pay

## 2020-12-19 ENCOUNTER — Encounter: Payer: Self-pay | Admitting: Physician Assistant

## 2020-12-19 ENCOUNTER — Ambulatory Visit (HOSPITAL_COMMUNITY): Payer: BC Managed Care – PPO | Attending: Cardiology

## 2020-12-19 DIAGNOSIS — U099 Post covid-19 condition, unspecified: Secondary | ICD-10-CM | POA: Diagnosis not present

## 2020-12-19 DIAGNOSIS — I2510 Atherosclerotic heart disease of native coronary artery without angina pectoris: Secondary | ICD-10-CM | POA: Insufficient documentation

## 2020-12-19 DIAGNOSIS — R0602 Shortness of breath: Secondary | ICD-10-CM | POA: Diagnosis not present

## 2020-12-19 DIAGNOSIS — R5383 Other fatigue: Secondary | ICD-10-CM | POA: Diagnosis not present

## 2020-12-19 LAB — ECHOCARDIOGRAM COMPLETE
Area-P 1/2: 3.91 cm2
S' Lateral: 2.3 cm

## 2021-01-05 NOTE — Progress Notes (Signed)
Cardiology Office Note:    Date:  01/06/2021   ID:  [name], DOB February 11, 1966, MRN [id number]  PCP:  Marin Olp, MD   [facility] Providers Cardiologist:  Jenkins Rouge, MD      Referring MD: Marin Olp, MD   Chief Complaint:  Follow-up (palpitations)    Patient Profile:    [name] is a 55 y.o. female with:   Coronary calcification (CT 07/2019)  Hyperlipidemia  GERD  Diverticulosis  ? Diverticulitis in 09/2018   Prior CV studies: NON-TELEMETRY MONITORING-INTERPRETATION ONLY 12/09/2020 Narrative NSR Sinus arrhythmia benign Artifact No significant arrhythmias with any symptoms including fluttering, dizziness and palpitations  Echocardiogram 12/19/20 1. Left ventricular ejection fraction, by estimation, is 60 to 65%. The left ventricle has normal function. The left ventricle has no regional wall motion abnormalities. Left ventricular diastolic parameters were normal. The average left ventricular  global longitudinal strain is -21.0 %. The global longitudinal strain is normal. 2. Right ventricular systolic function is normal. The right ventricular size is normal. 3. The mitral valve is normal in structure. Mild mitral valve regurgitation. No evidence of mitral stenosis. 4. The aortic valve is normal in structure. Aortic valve regurgitation is not visualized. No aortic stenosis is present. 5. The inferior vena cava is normal in size with greater than 50% respiratory variability, suggesting right atrial pressure of 3 mmHg.  CT CARDIAC SCORING 07/17/2019 Addendum 07/17/2019  1:18 PM Ascending aorta: Normal diameter Pericardium: Normal Coronary arteries: One small area of calcium noted in mid LAD and proximal RCA IMPRESSION: Coronary calcium score of 3. This was 33 th percentile for age and sex matched control.   History of Present Illness: Ms. [name] was last seen in 3/22.  She was having persistent symptoms after recent  COVID-19 infection.  She had symptoms of congestion, lightheadedness, fatigue, near syncope, palpitations, shortness of breath.  CBC and TSH were normal.  An echocardiogram showed normal EF and no pericardial effusion.  An event monitor showed no significant arrhythmias.  She returns for f/u.  She is here alone.  She went to the [facility] clinic with her son who is seen there.  She spoke to one of the physicians and they recommended taking an antihistamine.  Since taking antihistamines, her brain fog symptoms have improved.  Overall, she is feeling better.  However, she does not feel 100%.  She has not had any significant shortness of breath.  She does have some upper left chest pain that seems to radiate from her neck pain.        Past Medical History:  Diagnosis Date  . Allergy   . Coronary artery calcification    CT 07/2019: Ca score 3  . Diverticulosis    hx of diverticulitis   . Echocardiogram 12/2020   Echo 5/22: EF 60-65, no RWMA, GLS -21.0%, normal RVSF, mild MR, no pericardial effusion  . GERD (gastroesophageal reflux disease)   . History of chickenpox   . Hyperlipidemia     Current Medications: Current Meds  Medication Sig  . albuterol (VENTOLIN HFA) 108 (90 Base) MCG/ACT inhaler Inhale 1-2 puffs into the lungs every 6 (six) hours as needed for wheezing or shortness of breath.  . B COMPLEX VITAMINS PO Take 1 tablet by mouth daily.  . Calcium Carbonate (CALCIUM 500 PO) Take 1 tablet by mouth daily.  . cholecalciferol (VITAMIN D) 25 MCG (1000 UT) tablet Take 1,000 Units by mouth daily.  Marland Kitchen estradiol (ESTRACE) 1 MG tablet Take  1 mg by mouth daily.  Marland Kitchen imipramine (TOFRANIL) 25 MG tablet Take 25 mg by mouth in the morning and at bedtime.  . Quercetin 250 MG TABS Take 1 tablet by mouth daily.  Marland Kitchen topiramate (TOPAMAX) 25 MG tablet Take 25 mg by mouth as needed (for nerve pain).  . valACYclovir (VALTREX) 500 MG tablet Take 500 mg by mouth 2 (two) times daily.  . Zinc Acetate, Oral, (ZINC  ACETATE PO) Take 1 tablet by mouth daily.     Allergies:   Ciprofloxacin, Codeine, Doxycycline, Iodine, Penicillins, and Sulfonamide derivatives   Social History   Tobacco Use  . Smoking status: Never Smoker  . Smokeless tobacco: Never Used  Vaping Use  . Vaping Use: Never used  Substance Use Topics  . Alcohol use: Yes    Comment: social  . Drug use: Never     Family Hx: The patient's family history includes Cancer in her father and sister; Colon polyps in her sister; Diabetes in her father; Glaucoma in her mother; Hyperlipidemia in her mother; Hypertension in her father and mother.  ROS   EKGs/Labs/Other Test Reviewed:    EKG:  EKG is not ordered today.  The ekg ordered today demonstrates n/a  Recent Labs: 10/13/2020: ALT 15; BUN 15; Creatinine, Ser 0.84; Potassium 4.2; Sodium 139 11/01/2020: Hemoglobin 15.4; Platelets 275; TSH 1.040   Recent Lipid Panel Lab Results  Component Value Date/Time   CHOL 192 10/26/2019 12:00 AM   CHOL 192 10/26/2019 12:00 AM   TRIG 198 (A) 10/26/2019 12:00 AM   TRIG 198 (A) 10/26/2019 12:00 AM   HDL 69 10/26/2019 12:00 AM   HDL 69 10/26/2019 12:00 AM   LDLCALC 90 10/26/2019 12:00 AM   LDLCALC 85 07/07/2019 03:01 PM      Risk Assessment/Calculations:      Physical Exam:    VS:  BP (!) 90/52   Pulse 76   Ht 5\' 9"  (1.753 m)   Wt 157 lb (71.2 kg)   SpO2 98%   BMI 23.18 kg/m     Wt Readings from Last 3 Encounters:  01/06/21 157 lb (71.2 kg)  11/01/20 149 lb 6.4 oz (67.8 kg)  10/13/20 146 lb (66.2 kg)     Constitutional:      Appearance: Healthy appearance. Not in distress.  Neck:     Vascular: JVD normal.  Pulmonary:     Effort: Pulmonary effort is normal.     Breath sounds: No wheezing. No rales.  Cardiovascular:     Normal rate. Regular rhythm. Normal S1. Normal S2.     Murmurs: There is no murmur.  Edema:    Peripheral edema absent.  Abdominal:     Palpations: Abdomen is soft.  Skin:    General: Skin is warm and  dry.  Neurological:     Mental Status: Alert and oriented to person, place and time.     Cranial Nerves: Cranial nerves are intact.          ASSESSMENT & PLAN:    1. Palpitations 2. Long COVID She was recently seen for fatigue, palpitations, shortness of breath after having COVID-19.  She has symptoms of Long COVID.  Her echocardiogram showed normal EF and her monitor did not show any arrhythmias.  No further CV testing is needed.  She has a lot of neck symptoms and vertigo.  I have encouraged her to look into getting referred to physical therapy as this should likely help.  3. Coronary artery calcification seen  on CT scan She was previously on statin therapy.  She changed her diet and lost weight and came off of this.  She plans to have her lipids checked again with primary care in July.  She has not had any anginal symptoms.  Follow-up with Dr. Johnsie Cancel in 1 year.    Dispo:  Return in about 1 year (around 01/06/2022) for Routine Follow Up w Dr. Johnsie Cancel.   Medication Adjustments/Labs and Tests Ordered: Current medicines are reviewed at length with the patient today.  Concerns regarding medicines are outlined above.  Tests Ordered: No orders of the defined types were placed in this encounter.  Medication Changes: No orders of the defined types were placed in this encounter.   Signed, Richardson Dopp, PA-C  01/06/2021 9:40 AM    [facility] [address], [place], [place]  [address] Phone: [phone]; Fax: [phone]

## 2021-01-06 ENCOUNTER — Encounter: Payer: Self-pay | Admitting: Physician Assistant

## 2021-01-06 ENCOUNTER — Other Ambulatory Visit: Payer: Self-pay

## 2021-01-06 ENCOUNTER — Ambulatory Visit (INDEPENDENT_AMBULATORY_CARE_PROVIDER_SITE_OTHER): Payer: BC Managed Care – PPO | Admitting: Physician Assistant

## 2021-01-06 VITALS — BP 90/52 | HR 76 | Ht 69.0 in | Wt 157.0 lb

## 2021-01-06 DIAGNOSIS — I2510 Atherosclerotic heart disease of native coronary artery without angina pectoris: Secondary | ICD-10-CM | POA: Diagnosis not present

## 2021-01-06 DIAGNOSIS — R002 Palpitations: Secondary | ICD-10-CM

## 2021-01-06 DIAGNOSIS — U099 Post covid-19 condition, unspecified: Secondary | ICD-10-CM

## 2021-01-06 NOTE — Patient Instructions (Signed)
Medication Instructions:  Your physician recommends that you continue on your current medications as directed. Please refer to the Current Medication list given to you today.  *If you need a refill on your cardiac medications before your next appointment, please call your pharmacy*   Lab Work: None   If you have labs (blood work) drawn today and your tests are completely normal, you will receive your results only by: Marland Kitchen MyChart Message (if you have MyChart) OR . A paper copy in the mail If you have any lab test that is abnormal or we need to change your treatment, we will call you to review the results.   Testing/Procedures: None   Follow-Up: At [facility], you and your health needs are our priority.  As part of our continuing mission to provide you with exceptional heart care, we have created designated Provider Care Teams.  These Care Teams include your primary Cardiologist (physician) and Advanced Practice Providers (APPs -  Physician Assistants and Nurse Practitioners) who all work together to provide you with the care you need, when you need it.  We recommend signing up for the patient portal called "MyChart".  Sign up information is provided on this After Visit Summary.  MyChart is used to connect with patients for Virtual Visits (Telemedicine).  Patients are able to view lab/test results, encounter notes, upcoming appointments, etc.  Non-urgent messages can be sent to your provider as well.   To learn more about what you can do with MyChart, go to [email or web address].    Your next appointment:   1 year(s)  The format for your next appointment:   In Person  Provider:   Jenkins Rouge, MD   Other Instructions Your physician wants you to follow-up in: 1 year with  Dr. Johnsie Cancel.  You will receive a reminder letter in the mail two months in advance. If you don't receive a letter, please call our office to schedule the follow-up appointment.

## 2021-01-18 ENCOUNTER — Encounter: Payer: Self-pay | Admitting: Neurology

## 2021-01-18 ENCOUNTER — Ambulatory Visit: Payer: BC Managed Care – PPO | Admitting: Adult Health

## 2021-01-18 ENCOUNTER — Ambulatory Visit (INDEPENDENT_AMBULATORY_CARE_PROVIDER_SITE_OTHER): Payer: BC Managed Care – PPO | Admitting: Neurology

## 2021-01-18 VITALS — BP 121/81 | HR 82 | Ht 69.0 in | Wt 153.0 lb

## 2021-01-18 DIAGNOSIS — H9313 Tinnitus, bilateral: Secondary | ICD-10-CM | POA: Insufficient documentation

## 2021-01-18 DIAGNOSIS — R519 Headache, unspecified: Secondary | ICD-10-CM

## 2021-01-18 DIAGNOSIS — H04123 Dry eye syndrome of bilateral lacrimal glands: Secondary | ICD-10-CM | POA: Diagnosis not present

## 2021-01-18 DIAGNOSIS — G8929 Other chronic pain: Secondary | ICD-10-CM

## 2021-01-18 DIAGNOSIS — M5481 Occipital neuralgia: Secondary | ICD-10-CM

## 2021-01-18 DIAGNOSIS — H53143 Visual discomfort, bilateral: Secondary | ICD-10-CM | POA: Diagnosis not present

## 2021-01-18 DIAGNOSIS — U099 Post covid-19 condition, unspecified: Secondary | ICD-10-CM | POA: Insufficient documentation

## 2021-01-18 MED ORDER — ALPRAZOLAM 0.5 MG PO TABS: 0.2500 mg | ORAL_TABLET | Freq: Every evening | ORAL | 0 refills | Status: DC | PRN

## 2021-01-18 NOTE — Addendum Note (Signed)
Addended by: Gildardo Griffes on: 01/18/2021 12:14 PM   Modules accepted: Orders

## 2021-01-18 NOTE — Patient Instructions (Signed)
Occipital Neuralgia  Occipital neuralgia is a type of headache that causes brief episodes of very bad pain in the back of the head. Pain from occipital neuralgia may spread (radiate) to other parts of the head. These headaches may be caused by irritation of the nerves that leave the spinal cord high up in the neck, just below the base of the skull (occipital nerves). The occipital nerves transmit sensations from the back of the head, the top of the head, and the areas behind the ears. What are the causes? This condition can occur without any known cause (primary headache syndrome). In other cases, this condition is caused by pressure on or irritation of one of the two occipital nerves. Pressure and irritation may be due to:  Muscle spasm in the neck.  Neck injury.  Wear and tear of the vertebrae in the neck (osteoarthritis).  Disease of the disks that separate the vertebrae.  Swollen blood vessels that put pressure on the occipital nerves.  Infections.  Tumors.  Diabetes. What are the signs or symptoms? This condition causes brief burning, stabbing, electric, shocking, or shooting pain in the back of the head that can radiate to the top of the head. It can happen on one side or both sides of the head. It can also cause:  Pain behind the eye.  Pain triggered by neck movement or hair brushing.  Scalp tenderness.  Aching in the back of the head between episodes of very bad pain.  Pain that gets worse with exposure to bright lights. How is this diagnosed? Your health care provider may diagnose the condition based on a physical exam and your symptoms. Tests may be done, such as:  Imaging studies of the brain and neck (cervical spine), such as an MRI or CT scan. These look for causes of pinched nerves.  Applying pressure to the nerves in the neck to try to re-create the pain.  Injection of numbing medicine into the occipital nerve areas to see if pain goes away (diagnostic nerve  block).   How is this treated? Treatment for this condition may begin with simple measures, such as:  Rest.  Massage.  Applying heat or cold to the area.  Over-the-counter pain relievers. If these measures do not work, you may need other treatments, including:  Medicines, such as: ? Prescription-strength anti-inflammatory medicines. ? Muscle relaxants. ? Anti-seizure medicines, which can relieve pain. ? Antidepressants, which can relieve pain. ? Injected medicines, such as medicines that numb the area (local anesthetic) and steroids.  Pulsed radiofrequency ablation. This is when wires are implanted to deliver electrical impulses that block pain signals from the occipital nerve.  Surgery to relieve nerve pressure.  Physical therapy. Follow these instructions at home: Managing pain  Avoid any activities that cause pain.  Rest when you have an attack of pain.  Try gentle massage to relieve pain.  Try a different pillow or sleeping position.  If directed, apply heat to the affected area as often as told by your health care provider. Use the heat source that your health care provider recommends, such as a moist heat pack or a heating pad. ? Place a towel between your skin and the heat source. ? Leave the heat on for 20-30 minutes. ? Remove the heat if your skin turns bright red. This is especially important if you are unable to feel pain, heat, or cold. You have a greater risk of getting burned.  If directed, put ice on the back of your  head and neck area. To do this: ? Put ice in a plastic bag. ? Place a towel between your skin and the bag. ? Leave the ice on for 20 minutes, 2-3 times a day. ? Remove the ice if your skin turns bright red. This is very important. If you cannot feel pain, heat, or cold, you have a greater risk of damage to the area.      General instructions  Take over-the-counter and prescription medicines only as told by your health care  provider.  Avoid things that make your symptoms worse, such as bright lights.  Try to stay active. Get regular exercise that does not cause pain. Ask your health care provider to suggest safe exercises for you.  Work with a physical therapist to learn stretching exercises you can do at home.  Practice good posture.  Keep all follow-up visits. This is important. Contact a health care provider if:  Your medicine is not working.  You have new or worsening symptoms. Get help right away if:  You have very bad head pain that does not go away.  You have a sudden change in vision, balance, or speech. These symptoms may represent a serious problem that is an emergency. Do not wait to see if the symptoms will go away. Get medical help right away. Call your local emergency services (911 in the [place] Do not drive yourself to the hospital. Summary  Occipital neuralgia is a type of headache that causes brief episodes of very bad pain in the back of the head.  Pain from occipital neuralgia may spread (radiate) to other parts of the head.  Treatment for this condition includes rest, massage, and medicines. This information is not intended to replace advice given to you by your health care provider. Make sure you discuss any questions you have with your health care provider. Document Revised: 05/29/2020 Document Reviewed: 05/29/2020 Elsevier Patient Education  2021 [employer].

## 2021-01-18 NOTE — Progress Notes (Signed)
Provider:  Larey Seat, M D  Referring Provider: Marin Olp, MD Primary Care Physician:  Marin Olp, MD  Chief Complaint  Patient presents with  . New Patient (Initial Visit)    Pt alone, rm 11. Presents today for January 2022 infection with COVID- post covid symptoms :She describes brain fog and slow responses, ringing of the ears (which ENT eval) c/o of pain and head feels heavy posterior head between the ears. Patient here for follow-up. She does feel she can function better. She was able to wake up and do a work meeting this morning. Her occipital isn't hurting everyday. She is talking "calm" which helps her sleep better at night and in the morning she feels more relaxed and she doesn't notice the ringing in her ears as much at night. Brain fog is better in the morning after she takes Calm. Patient here for follow-up. She does feel she can function better. She was able to wake up and do a work meeting this morning. Her occipital isn't hurting everyday. She is talking "calm" which helps her sleep better at night and in the morning she feels more relaxed and she doesn't notice the ringing in her ears as much at night. Brain fog is better in the morning after she takes Calm.     HPI: 01-18-2021:  [name] is a 55 y.o. female patient and seen in a RV.  Her MRI brain was normal, her MOCA had been 27/ 30 , today's MMSE was 26/30- she feels as if her cognitive processing is improving but still thinking is slower - she noted  During a grocery store visit that she had  Left the store before she got all items she went for.    The patient was seen last 10-13-2020 upon referral from [facility]. [name] felt overall healthy and well until she contracted COVID on January 11 at a funeral. There were several close friends attending, there was at least one family member that was Covid positive among the other parties but the relatives did not attend the funeral with  masks. Several people seem to have been affected during visitation earlier that week and Mrs. Steinke had symptoms with an 3 to 5 days after the funeral. She tested positive for the infection as her main symptoms were sinus pressure, she did receive an antibiotic which helped with clearing some of that up but she developed tinnitus tinnitus, headache and neck stiffness and she lost taste and smell which is not that common with the omicron -variant. Some of the other guests had coughing, fatigue, chills. Her main concern is brain fog - and loss of smell. She may have contracted delta?  She is lightheaded, yet feels she is all slowed.   Todays concern is for memory and smell loss-  She can smell lysol, which she sprays generously everywhere.  She had smelled things that weren't there , smoke and excrements-  Lost taste for citrus.   10-13-20: Lavender- " menthol" - finally got it right. Coconut- yes.  Almond- yes Vanilla - yes peppermint- yes [place]- 'Orange' . Clove- no- "cinnamon" -nutmeg- unpleasant to her.     Review of Systems: Out of a complete 14 system review, the patient complains of only the following symptoms, and all other reviewed systems are negative. Fatigue , post viral.  Photosensitivity, dry eyes.  reports palpitations at night VERTIGO  Parosmia hoarseness- may be from sinus , may be from lysol  spray. Orthostatic- repeated today.  Semi functioning [place] 19.   Social History   Socioeconomic History  . Marital status: Married    Spouse name: Not on file  . Number of children: Not on file  . Years of education: Not on file  . Highest education level: Not on file  Occupational History  . Not on file  Tobacco Use  . Smoking status: Never Smoker  . Smokeless tobacco: Never Used  Vaping Use  . Vaping Use: Never used  Substance and Sexual Activity  . Alcohol use: Yes    Comment: social  . Drug use: Never  . Sexual activity: Yes    Birth  control/protection: Surgical  Other Topics Concern  . Not on file  Social History Narrative   Lives at home with spouse and 2 sons   Right handed   Caffeine: no   Social Determinants of Health   Financial Resource Strain: Not on file  Food Insecurity: Not on file  Transportation Needs: Not on file  Physical Activity: Not on file  Stress: Not on file  Social Connections: Not on file  Intimate Partner Violence: Not on file    Family History  Problem Relation Age of Onset  . Hyperlipidemia Mother   . Hypertension Mother   . Glaucoma Mother   . Diabetes Father   . Hypertension Father   . Cancer Father        Melanoma  . Colon polyps Sister   . Cancer Sister        Skin    Past Medical History:  Diagnosis Date  . Allergy   . Coronary artery calcification    CT 07/2019: Ca score 3  . Diverticulosis    hx of diverticulitis   . Echocardiogram 12/2020   Echo 5/22: EF 60-65, no RWMA, GLS -21.0%, normal RVSF, mild MR, no pericardial effusion  . GERD (gastroesophageal reflux disease)   . History of chickenpox   . Hyperlipidemia   . Wears eyeglasses    dry eyes and cornea change     Past Surgical History:  Procedure Laterality Date  . ABDOMINAL HYSTERECTOMY    . BLADDER SUSPENSION    . CHOLECYSTECTOMY      Current Outpatient Medications  Medication Sig Dispense Refill  . albuterol (VENTOLIN HFA) 108 (90 Base) MCG/ACT inhaler Inhale 1-2 puffs into the lungs every 6 (six) hours as needed for wheezing or shortness of breath.    . Ascorbic Acid (VITAMIN C PO) Take by mouth.    . B COMPLEX VITAMINS PO Take 1 tablet by mouth daily.    . Calcium Carbonate (CALCIUM 500 PO) Take 1 tablet by mouth daily.    . Cetirizine HCl (ZYRTEC PO) Take by mouth.    . cholecalciferol (VITAMIN D) 25 MCG (1000 UT) tablet Take 1,000 Units by mouth daily.    Marland Kitchen estradiol (ESTRACE) 1 MG tablet Take 1 mg by mouth daily.    . Fluticasone Propionate (FLONASE NA) Place into the nose.    .  Loratadine (CLARITIN PO) Take by mouth.    . Zinc Acetate, Oral, (ZINC ACETATE PO) Take 1 tablet by mouth daily.    Marland Kitchen imipramine (TOFRANIL) 25 MG tablet Take 25 mg by mouth in the morning and at bedtime. (Patient not taking: Reported on 01/18/2021)    . topiramate (TOPAMAX) 25 MG tablet Take 25 mg by mouth as needed (for nerve pain). (Patient not taking: Reported on 01/18/2021)     No  current facility-administered medications for this visit.    Allergies as of 01/18/2021 - Review Complete 01/18/2021  Allergen Reaction Noted  . Ciprofloxacin Hives and Nausea And Vomiting 09/22/2018  . Codeine  07/07/2019  . Doxycycline Itching 08/15/2015  . Iodine  07/07/2019  . Penicillins    . Sulfonamide derivatives      Vitals: BP 121/81 (BP Location: Left Arm, Patient Position: Standing)   Pulse 82   Ht 5\' 9"  (1.753 m)   Wt 153 lb (69.4 kg)   BMI 22.59 kg/m    Orthostatic VS for the past 72 hrs (Last 3 readings):  Patient Position BP Location  01/18/21 1116 Standing Left Arm  121/81 with regular HR of 82 bpm.     Last Weight:  Wt Readings from Last 1 Encounters:  01/18/21 153 lb (69.4 kg)   Last Height:   Ht Readings from Last 1 Encounters:  01/18/21 5\' 9"  (1.753 m)     Physical exam:  General: The patient is awake, alert and appears fatigued, headache , dry eyes.  The patient is well groomed. Head: Normocephalic, atraumatic. Neck is supple. Mallampati , neck circumference: 15 Cardiovascular:  Regular rate and rhythm- reports palpitations at night- , without  murmurs or carotid bruit, and without distended neck veins. Respiratory: Lungs are clear to auscultation. Skin:  Without evidence of edema, or rash- red eyes. Now with glasses, no longer using contacts . Trunk: BMI is 22.56 kg/m2.   Neurologic exam : The patient is awake and alert, oriented to place and time.  Memory subjective impaired-. There is a normal attention span & concentration ability.  Speech is fluent without  dysarthria, but with  dysphonia and subjective  Aphasia.  Mood and affect are depressed, worried.Jenetta Loges Cognitive Assessment  10/13/2020  Visuospatial/ Executive (0/5) 5  Naming (0/3) 3  Attention: Read list of digits (0/2) 2  Attention: Read list of letters (0/1) 1  Attention: Serial 7 subtraction starting at 100 (0/3) 3  Language: Repeat phrase (0/2) 1  Language : Fluency (0/1) 1  Abstraction (0/2) 2  Delayed Recall (0/5) 3  Orientation (0/6) 6  Total 27   MMSE - Mini Mental State Exam 01/18/2021  Orientation to time 4  Orientation to Place 5  Registration 3  Attention/ Calculation 4  Recall 1  Language- name 2 objects 2  Language- repeat 1  Language- follow 3 step command 3  Language- read & follow direction 1  Write a sentence 1  Copy design 1  Total score 26     Cranial nerves: smell and taste impairment.  Pupils are equal and briskly reactive to light. Funduscopic exam without  evidence of pallor or edema. Extraocular movements  in vertical and horizontal planes intact and without nystagmus.  Visual fields by finger perimetry are intact. Hearing to finger rub intact.  Facial sensation intact to fine touch. Facial motor strength is symmetric and tongue and uvula move midline. Tongue protrusion into either cheek is normal. Shoulder shrug is normal.   Motor exam:  has normal tone ,muscle bulk and symmetric  strength in all extremities. Sensory:  Fine touch, pinprick and vibration were tested in all extremities. Proprioception was normal. Coordination: Rapid alternating movements in the fingers/hands were normal. Finger-to-nose maneuver normal without evidence of ataxia, dysmetria or tremor. Gait and station: Patient walks without assistive device and is able unassisted to climb up to the exam table. Strength within normal limits. Stance is stable and normal. Tandem gait is  unfragmented. Romberg testing is negative  Deep tendon reflexes: in the  upper and lower  extremities are symmetric and intact. Babinski maneuver response is  downgoing.   Assessment:  After physical and neurologic examination, review of laboratory studies, imaging, neurophysiology testing and pre-existing records, assessment is that of :  Anosmia, improved - and taste , too.   Reports increased  feeling of dry eyes, sinus pressure and headache.   She is anxious and feels impaired cognitively, physically, has palpitations. Cardiac monitoring, 30 days, and EKG was normal.  Tension neck and occipital headaches and sinus headaches were addressed: .    Plan:  Treatment plan and additional workup : Cognitive impairment is still reported, and her short term memory function is impaired. MRI normal.  Dry eyes, check for Sjoegren and check ANA. Uses Systane.  Photophobia, may be a form of migraine - Topiramate has helped. She stopped it meanwhile .  Stop all aerosols. Hydrate , hydrate with electrolytes.  Tension neck and occipital headaches- more of an occipital neuralgia.   Vertigo , meclizine or low dose xanax.   Slow increase in daily activity.   Asencion Partridge Oliviagrace Crisanti MD 01/18/2021

## 2021-01-18 NOTE — Progress Notes (Addendum)
                           Orthostatics   BP supine (x 5 minutes) -- 129/90 --  HR supine (x 5 minutes) -- 74 --  BP sitting -- 132/84 132/84  --  HR sitting -- 67 --  BP standing (after 1 minute) -- 120/77 when asked about dizziness, pt stated "not too bad" --  HR standing (after 1 minute) -- 80 --  BP standing (after 3 minutes) -- 121/81 --  HR standing (after 3 minutes) -- 82 --  Orthostatics Comment -- a little dizzy upon sitting up

## 2021-01-23 NOTE — Progress Notes (Signed)
Only ANA pending- screening for autoimmune / rheumatoid diseases.

## 2021-01-24 ENCOUNTER — Encounter: Payer: Self-pay | Admitting: Neurology

## 2021-01-24 LAB — CBC WITH DIFFERENTIAL/PLATELET
Basophils Absolute: 0 10*3/uL (ref 0.0–0.2)
Basos: 1 %
EOS (ABSOLUTE): 0.4 10*3/uL (ref 0.0–0.4)
Eos: 7 %
Hematocrit: 46.4 % (ref 34.0–46.6)
Hemoglobin: 15.7 g/dL (ref 11.1–15.9)
Immature Grans (Abs): 0 10*3/uL (ref 0.0–0.1)
Immature Granulocytes: 0 %
Lymphocytes Absolute: 1.8 10*3/uL (ref 0.7–3.1)
Lymphs: 27 %
MCH: 31.2 pg (ref 26.6–33.0)
MCHC: 33.8 g/dL (ref 31.5–35.7)
MCV: 92 fL (ref 79–97)
Monocytes Absolute: 0.5 10*3/uL (ref 0.1–0.9)
Monocytes: 7 %
Neutrophils Absolute: 3.7 10*3/uL (ref 1.4–7.0)
Neutrophils: 58 %
Platelets: 326 10*3/uL (ref 150–450)
RBC: 5.04 x10E6/uL (ref 3.77–5.28)
RDW: 12.2 % (ref 11.7–15.4)
WBC: 6.5 10*3/uL (ref 3.4–10.8)

## 2021-01-24 LAB — MULTIPLE MYELOMA PANEL, SERUM
Albumin SerPl Elph-Mcnc: 4.2 g/dL (ref 2.9–4.4)
Albumin/Glob SerPl: 1.3 (ref 0.7–1.7)
Alpha 1: 0.2 g/dL (ref 0.0–0.4)
Alpha2 Glob SerPl Elph-Mcnc: 0.7 g/dL (ref 0.4–1.0)
B-Globulin SerPl Elph-Mcnc: 1.3 g/dL (ref 0.7–1.3)
Gamma Glob SerPl Elph-Mcnc: 1.3 g/dL (ref 0.4–1.8)
Globulin, Total: 3.5 g/dL (ref 2.2–3.9)
IgA/Immunoglobulin A, Serum: 290 mg/dL (ref 87–352)
IgG (Immunoglobin G), Serum: 1257 mg/dL (ref 586–1602)
IgM (Immunoglobulin M), Srm: 127 mg/dL (ref 26–217)

## 2021-01-24 LAB — COMPREHENSIVE METABOLIC PANEL
ALT: 19 IU/L (ref 0–32)
AST: 22 IU/L (ref 0–40)
Albumin/Globulin Ratio: 1.8 (ref 1.2–2.2)
Albumin: 4.9 g/dL (ref 3.8–4.9)
Alkaline Phosphatase: 67 IU/L (ref 44–121)
BUN/Creatinine Ratio: 20 (ref 9–23)
BUN: 18 mg/dL (ref 6–24)
Bilirubin Total: 1.1 mg/dL (ref 0.0–1.2)
CO2: 25 mmol/L (ref 20–29)
Calcium: 10 mg/dL (ref 8.7–10.2)
Chloride: 99 mmol/L (ref 96–106)
Creatinine, Ser: 0.9 mg/dL (ref 0.57–1.00)
Globulin, Total: 2.8 g/dL (ref 1.5–4.5)
Glucose: 87 mg/dL (ref 65–99)
Potassium: 4.4 mmol/L (ref 3.5–5.2)
Sodium: 139 mmol/L (ref 134–144)
Total Protein: 7.7 g/dL (ref 6.0–8.5)
eGFR: 76 mL/min/{1.73_m2} (ref >59)

## 2021-01-24 LAB — TSH: TSH: 0.984 u[IU]/mL (ref 0.450–4.500)

## 2021-01-24 LAB — HEMOGLOBIN A1C
Est. average glucose Bld gHb Est-mCnc: 103 mg/dL
Hgb A1c MFr Bld: 5.2 % (ref 4.8–5.6)

## 2021-01-24 LAB — SJOGREN'S SYNDROME ANTIBODS(SSA + SSB)
ENA SSA (RO) Ab: <0.2 AI (ref 0.0–0.9)
ENA SSB (LA) Ab: <0.2 AI (ref 0.0–0.9)

## 2021-01-24 LAB — ANA W/REFLEX: ANA Titer 1: NEGATIVE

## 2021-01-24 LAB — VITAMIN B12: Vitamin B-12: 980 pg/mL (ref 232–1245)

## 2021-01-24 LAB — SEDIMENTATION RATE: Sed Rate: 7 mm/hr (ref 0–40)

## 2021-02-24 ENCOUNTER — Ambulatory Visit (INDEPENDENT_AMBULATORY_CARE_PROVIDER_SITE_OTHER)
Admission: RE | Admit: 2021-02-24 | Discharge: 2021-02-24 | Disposition: A | Discharge status: Home / Self Care | Payer: BC Managed Care – PPO | Source: Ambulatory Visit | Attending: Family Medicine | Admitting: Family Medicine

## 2021-02-24 ENCOUNTER — Ambulatory Visit (INDEPENDENT_AMBULATORY_CARE_PROVIDER_SITE_OTHER): Payer: BC Managed Care – PPO | Admitting: Family Medicine

## 2021-02-24 ENCOUNTER — Other Ambulatory Visit: Payer: Self-pay

## 2021-02-24 ENCOUNTER — Encounter: Payer: Self-pay | Admitting: Family Medicine

## 2021-02-24 VITALS — BP 111/74 | HR 87 | Temp 98.2°F | Ht 69.0 in | Wt 152.2 lb

## 2021-02-24 DIAGNOSIS — B009 Herpesviral infection, unspecified: Secondary | ICD-10-CM

## 2021-02-24 DIAGNOSIS — K219 Gastro-esophageal reflux disease without esophagitis: Secondary | ICD-10-CM

## 2021-02-24 DIAGNOSIS — Z780 Asymptomatic menopausal state: Secondary | ICD-10-CM

## 2021-02-24 DIAGNOSIS — Z0000 Encounter for general adult medical examination without abnormal findings: Secondary | ICD-10-CM | POA: Diagnosis not present

## 2021-02-24 DIAGNOSIS — E7800 Pure hypercholesterolemia, unspecified: Secondary | ICD-10-CM | POA: Diagnosis not present

## 2021-02-24 DIAGNOSIS — E8940 Asymptomatic postprocedural ovarian failure: Secondary | ICD-10-CM

## 2021-02-24 DIAGNOSIS — J309 Allergic rhinitis, unspecified: Secondary | ICD-10-CM

## 2021-02-24 DIAGNOSIS — Z114 Encounter for screening for human immunodeficiency virus [HIV]: Secondary | ICD-10-CM

## 2021-02-24 DIAGNOSIS — Z1159 Encounter for screening for other viral diseases: Secondary | ICD-10-CM | POA: Diagnosis not present

## 2021-02-24 DIAGNOSIS — E785 Hyperlipidemia, unspecified: Secondary | ICD-10-CM

## 2021-02-24 DIAGNOSIS — E041 Nontoxic single thyroid nodule: Secondary | ICD-10-CM | POA: Diagnosis not present

## 2021-02-24 DIAGNOSIS — F4321 Adjustment disorder with depressed mood: Secondary | ICD-10-CM

## 2021-02-24 DIAGNOSIS — N3281 Overactive bladder: Secondary | ICD-10-CM

## 2021-02-24 DIAGNOSIS — R490 Dysphonia: Secondary | ICD-10-CM

## 2021-02-24 LAB — POC URINALSYSI DIPSTICK (AUTOMATED)
Bilirubin, UA: NEGATIVE
Blood, UA: NEGATIVE
Glucose, UA: NEGATIVE
Ketones, UA: NEGATIVE
Leukocytes, UA: NEGATIVE
Nitrite, UA: NEGATIVE
Protein, UA: NEGATIVE
Spec Grav, UA: 1.015 (ref 1.010–1.025)
Urobilinogen, UA: NEGATIVE E.U./dL — AB
pH, UA: 6 (ref 5.0–8.0)

## 2021-02-24 LAB — LIPID PANEL
Cholesterol: 347 mg/dL — ABNORMAL HIGH (ref 0–200)
HDL: 72.2 mg/dL (ref >39.00)
NonHDL: 274.34
Total CHOL/HDL Ratio: 5
Triglycerides: 236 mg/dL — ABNORMAL HIGH (ref 0.0–149.0)
VLDL: 47.2 mg/dL — ABNORMAL HIGH (ref 0.0–40.0)

## 2021-02-24 LAB — LDL CHOLESTEROL, DIRECT: Direct LDL: 237 mg/dL

## 2021-02-24 NOTE — Assessment & Plan Note (Signed)
S: Medication: on statin in past- was on this when she got coronary calcium scoring of 3 07/17/2019 with score of 3 whih is 84%- she spoke with cardiology who thought score could be higher due to being on statin.   [facility] trying recently but was hard with covid.  Lab Results  Component Value Date   CHOL 192 10/26/2019   CHOL 192 10/26/2019   HDL 69 10/26/2019   HDL 69 10/26/2019   LDLCALC 90 10/26/2019   TRIG 198 (A) 10/26/2019   TRIG 198 (A) 10/26/2019   CHOLHDL 2.7 07/07/2019   A/P: hopefully stable- update lipids with labs- hopefully wife lifestyle changes will improve- could consider repeat 2025-2030 to check stability

## 2021-02-24 NOTE — Progress Notes (Signed)
Phone [phone]   Subjective:  Patient presents today for their annual physical. Establish visit from [facility]. Chief complaint-noted.   See problem oriented charting- Review of Systems  Constitutional:  Negative for chills and fever.  HENT:  Positive for congestion, hearing loss and tinnitus.   Eyes:  Positive for blurred vision and photophobia. Negative for double vision.  Respiratory:  Negative for cough and shortness of breath.   Cardiovascular:  Negative for chest pain and palpitations.  Gastrointestinal:  Negative for abdominal pain, constipation, diarrhea, heartburn and nausea.  Genitourinary:  Negative for dysuria and frequency.  Musculoskeletal:  Positive for myalgias. Negative for falls.  Skin:  Negative for itching and rash.  Neurological:  Positive for dizziness and headaches.  Endo/Heme/Allergies:  Negative for polydipsia. Does not bruise/bleed easily.  Psychiatric/Behavioral:  Negative for depression and suicidal ideas.    The following were reviewed and entered/updated in epic: Past Medical History:  Diagnosis Date   Allergy    Coronary artery calcification    CT 07/2019: Ca score 3   Diverticulosis    hx of diverticulitis    Echocardiogram 12/2020   Echo 5/22: EF 60-65, no RWMA, GLS -21.0%, normal RVSF, mild MR, no pericardial effusion   GERD (gastroesophageal reflux disease)    History of chickenpox    Hyperlipidemia    Wears eyeglasses    dry eyes and cornea change    Patient Active Problem List   Diagnosis Date Noted   Bilateral occipital neuralgia 01/18/2021    Priority: High   Dry eye syndrome of both eyes 01/18/2021    Priority: High   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 01/18/2021    Priority: High   Postviral syndrome 10/13/2020    Priority: High   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 10/13/2020    Priority: High   Tinnitus aurium, bilateral 01/18/2021    Priority: Medium   Chronic tension-type  headache, intractable 10/13/2020    Priority: Medium   Occipital headache 10/13/2020    Priority: Medium   Fatty liver 09/19/2020    Priority: Medium   Pure hypercholesterolemia 07/07/2019    Priority: Medium   Postsurgical menopause 08/13/2009    Priority: Medium   Allergic rhinitis 11/03/2008    Priority: Medium   Photophobia of both eyes 01/18/2021    Priority: Low   Orthostatic hypertension 10/13/2020    Priority: Low   Dysphonia 10/13/2020    Priority: Low   Diverticular disease of colon 09/19/2020    Priority: Low   Cavernous hemangioma of liver 08/21/2019    Priority: Low   Thyroid nodule 08/21/2019    Priority: Low   Herpes simplex 07/07/2019    Priority: Low   GERD (gastroesophageal reflux disease) 07/07/2019    Priority: Low   Overactive bladder 07/07/2019    Priority: Low   Situational depression 11/03/2008    Priority: Low   Past Surgical History:  Procedure Laterality Date   ABDOMINAL HYSTERECTOMY     BLADDER SUSPENSION     CHOLECYSTECTOMY     LAPAROSCOPIC ENDOMETRIOSIS FULGURATION     3 separate surgeries    Family History  Problem Relation Age of Onset   Hyperlipidemia Mother    Hypertension Mother    Glaucoma Mother    Memory loss Mother        being evaluated for alzheimers   Diabetes Father    Hypertension Father    Cancer Father        Melanoma   Dementia  Father    Colon polyps Sister    Cancer Sister        Skin   Parkinson's disease Maternal Grandfather    Other Son        PANDAS    Medications- reviewed and updated Current Outpatient Medications  Medication Sig Dispense Refill   Ascorbic Acid (VITAMIN C PO) Take by mouth.     B COMPLEX VITAMINS PO Take 1 tablet by mouth daily.     Calcium Carbonate (CALCIUM 500 PO) Take 1 tablet by mouth daily.     Cetirizine HCl (ZYRTEC PO) Take by mouth.     cholecalciferol (VITAMIN D) 25 MCG (1000 UT) tablet Take 1,000 Units by mouth daily.     estradiol (ESTRACE) 1 MG tablet Take 1 mg by  mouth daily.     Fluticasone Propionate (FLONASE NA) Place into the nose.     Loratadine (CLARITIN PO) Take by mouth.     Zinc Acetate, Oral, (ZINC ACETATE PO) Take 1 tablet by mouth daily.     No current facility-administered medications for this visit.   Allergies-reviewed and updated Allergies  Allergen Reactions   Ciprofloxacin Hives and Nausea And Vomiting   Codeine    Doxycycline Itching   Iodine    Penicillins     REACTION: hives   Sulfonamide Derivatives     REACTION: rash    Social History   Social History Narrative   Lives at home with spouse. [name] 21 and [name] 17  at home in 2022.       Disabled 2022 from [facility] cov[facility]- had been doing temp prior to long covid      Hobbies: enjoys watching thomas play basketball, beach, time with friends, shopping      Right handed   Caffeine: no   Objective  Objective:  BP 111/74   Pulse 87   Temp 98.2 F (36.8 C) (Temporal)   Ht 5\' 9"  (1.753 m)   Wt 152 lb 3.2 oz (69 kg)   SpO2 97%   BMI 22.48 kg/m  Gen: NAD, resting comfortably HEENT: Mucous membranes are moist. Oropharynx normal Neck: no thyromegaly CV: RRR no murmurs rubs or gallops Lungs: CTAB no crackles, wheeze, rhonchi Abdomen: soft/nontender/nondistended/normal bowel sounds. No rebound or guarding.  Ext: no edema Skin: warm, dry Neuro: grossly normal, moves all extremities, PERRLA   Assessment and Plan   55 y.o. female presenting for annual physical.  Health Maintenance counseling: 1. Anticipatory guidance: Patient counseled regarding regular dental exams -q6 months advised, eye exams - yearly or more right now with dry eyes,  avoiding smoking and second hand smoke , limiting alcohol to 1 beverage per day- 1 a week .   2. Risk factor reduction:  Advised patient of need for regular exercise and diet rich and fruits and vegetables to reduce risk of heart attack and stroke. Exercise- has not exercised recently due to long covid. Diet-doing optavia and no  exercise recommended until maintenance.  Wt Readings from Last 3 Encounters:  02/24/21 152 lb 3.2 oz (69 kg)  01/18/21 153 lb (69.4 kg)  01/06/21 157 lb (71.2 kg)  3. Immunizations/screenings/ancillary studies- discussed Tdap today, hiv and HCV screening  Immunization History  Administered Date(s) Administered   Influenza Split 07/19/2007, 08/01/2009, 07/25/2011, 06/05/2012   Influenza,inj,quad, With Preservative 08/09/2014, 08/10/2015   Influenza-Unspecified 05/13/2014, 05/16/2015   PFIZER(Purple Top)SARS-COV-2 Vaccination 11/05/2019, 11/26/2019  4. Cervical cancer screening- will get a copy of records from [facility] 5. Breast cancer screening-  breast exam with GYN and mammogram 02/24/20- due next week 6. Colon cancer screening - 10/12/14 with Dr. Collene Mares- was told 5 years now due 7. Skin cancer screening- plans to get reestablished with dermatology. advised regular sunscreen use. Denies worrisome, changing, or new skin lesions.  8. Birth control/STD check- postmenopausal and monogomous 9. Osteoporosis screening at 75- wants to do early- ordreed today -Never smoker  Status of chronic or acute concerns   #social update- son Coralie Common- went to [place] [facility] and still no clear answers. Diarrhea 5x a day and still gets abdominal pain. Had great visit with internist but GI not as helpful- bilirubin still high- has upcoming virtual visit.   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 Persists.  -also has seen a tinnitus clinic at [facility]  (out of pocket)after seeing ENT. Persists- does not want to use devices -has also seen chiropractor - also had occipital neuralgia- working with Dr. Brett Fairy- that is still bothering her- cold water in AM does help her.    #hyperlipidemia S: Medication: on statin in past- was on this when she got coronary calcium scoring of 3 07/17/2019 with score of 3 whih is 84%- she spoke with cardiology who thought score could be higher due to being on statin.   Optavia trying recently but was hard with covid.  Lab Results  Component Value Date   CHOL 192 10/26/2019   CHOL 192 10/26/2019   HDL 69 10/26/2019   HDL 69 10/26/2019   LDLCALC 90 10/26/2019   TRIG 198 (A) 10/26/2019   TRIG 198 (A) 10/26/2019   CHOLHDL 2.7 07/07/2019   A/P: hopefully stable- update lipids with labs- hopefully wife lifestyle changes will improve- could consider repeat 2025-2030 to check stability - mild urine odor wants to check ua  #thyroid nodule 2015- did not meet criteria for biopsy. TSH has been normal and recently checked Lab Results  Component Value Date   TSH 0.984 01/18/2021   Recommended follow up: Return in about 1 year (around 02/24/2022) for physical or sooner if needed. Future Appointments  Date Time Provider [place]  08/22/2021 10:30 AM Dohmeier, Asencion Partridge, MD [facility] None    Chief Complaint  Patient presents with   Transitions Of Care    Patient states that she wants labs done and to discuss the long term effects from [place].    Annual Exam   Lab/Order associations: fasting   ICD-10-CM   1. Encounter for hepatitis C screening test for low risk patient  Z11.59 Hepatitis C antibody    2. Thyroid nodule  E04.1     3. Pure hypercholesterolemia  E78.00 Lipid panel    POCT Urinalysis Dipstick (Automated)    4. Overactive bladder  N32.81     5. Herpes simplex  B00.9     6. Gastroesophageal reflux disease without esophagitis  K21.9     7. Dysphonia  R49.0     8. Situational depression  F43.21     9. Postsurgical menopause  E89.40     10. Allergic rhinitis, unspecified seasonality, unspecified trigger  J30.9     11. Screening for HIV (human immunodeficiency virus)  Z11.4 HIV antibody (with reflex)     12. Postmenopausal  Z78.0 DG Bone Density      Return precautions  advised.  Garret Reddish, MD

## 2021-02-24 NOTE — Patient Instructions (Addendum)
Health Maintenance Due  Topic Date Due   HIV Screening Done today.  Never done   Hepatitis C Screening Done today.  Never done   TETANUS/TDAP Differed.  Never done   PAP SMEAR-Modifier Sign release of information at the check out desk for GYN  01/11/2018   Can stop by desk to get refund for copay as we did physical   Sign release of information at the check out desk for last colonoscopy from 2016  She is going to call GI to schedule when in better position from long covid  Schedule your bone density test at check out desk. You may also call directly to X-ray at [phone] to schedule an appointment that is convenient for you.  - located [address] across the street from [place] - in the basement - you do need an appointment for the bone density tests.   Please stop by lab before you go If you have mychart- we will send your results within 3 business days of [place] receiving them.  If you do not have mychart- we will call you about results within 5 business days of [place] receiving them.  *please also note that you will see labs on mychart as soon as they post. I will later go in and write notes on them- will say "notes from Dr. Yong Channel"   Recommended follow up: Return in about 1 year (around 02/24/2022) for physical or sooner if needed.

## 2021-02-24 NOTE — Addendum Note (Signed)
Addended by: Loura Back on: 02/24/2021 03:40 PM   Modules accepted: Orders

## 2021-02-24 NOTE — Assessment & Plan Note (Signed)
#  thyroid nodule 2015- did not meet criteria for biopsy. TSH has been normal and recently checked Lab Results  Component Value Date   TSH 0.984 01/18/2021

## 2021-02-24 NOTE — Addendum Note (Signed)
Addended by: Linton Ham on: 02/24/2021 03:23 PM   Modules accepted: Orders

## 2021-02-25 DIAGNOSIS — Z780 Asymptomatic menopausal state: Secondary | ICD-10-CM | POA: Diagnosis not present

## 2021-02-27 LAB — HEPATITIS C ANTIBODY
Hepatitis C Ab: NONREACTIVE
SIGNAL TO CUT-OFF: 0.02 (ref <1.00)

## 2021-02-27 LAB — HIV ANTIBODY (ROUTINE TESTING W REFLEX): HIV 1&2 Ab, 4th Generation: NONREACTIVE

## 2021-04-11 ENCOUNTER — Encounter: Payer: Self-pay | Admitting: Family Medicine

## 2021-04-14 ENCOUNTER — Encounter: Payer: Self-pay | Admitting: Family Medicine

## 2021-04-21 ENCOUNTER — Other Ambulatory Visit: Payer: Self-pay | Admitting: Obstetrics & Gynecology

## 2021-04-21 DIAGNOSIS — R928 Other abnormal and inconclusive findings on diagnostic imaging of breast: Secondary | ICD-10-CM

## 2021-04-26 ENCOUNTER — Ambulatory Visit
Admission: RE | Admit: 2021-04-26 | Discharge: 2021-04-26 | Disposition: A | Discharge status: Home / Self Care | Payer: BC Managed Care – PPO | Source: Ambulatory Visit | Attending: Obstetrics & Gynecology | Admitting: Obstetrics & Gynecology

## 2021-04-26 ENCOUNTER — Other Ambulatory Visit: Payer: Self-pay

## 2021-04-26 DIAGNOSIS — R928 Other abnormal and inconclusive findings on diagnostic imaging of breast: Secondary | ICD-10-CM

## 2021-04-26 LAB — HM MAMMOGRAPHY

## 2021-07-04 ENCOUNTER — Other Ambulatory Visit: Payer: Self-pay

## 2021-07-04 ENCOUNTER — Ambulatory Visit: Payer: BC Managed Care – PPO | Admitting: Family Medicine

## 2021-07-04 VITALS — BP 116/74 | HR 88 | Temp 97.7°F | Wt 156.6 lb

## 2021-07-04 DIAGNOSIS — K5792 Diverticulitis of intestine, part unspecified, without perforation or abscess without bleeding: Secondary | ICD-10-CM

## 2021-07-04 DIAGNOSIS — R1032 Left lower quadrant pain: Secondary | ICD-10-CM

## 2021-07-04 DIAGNOSIS — R300 Dysuria: Secondary | ICD-10-CM | POA: Diagnosis not present

## 2021-07-04 LAB — COMPREHENSIVE METABOLIC PANEL
ALT: 12 U/L (ref 0–35)
AST: 14 U/L (ref 0–37)
Albumin: 4.3 g/dL (ref 3.5–5.2)
Alkaline Phosphatase: 56 U/L (ref 39–117)
BUN: 14 mg/dL (ref 6–23)
CO2: 32 mEq/L (ref 19–32)
Calcium: 9.6 mg/dL (ref 8.4–10.5)
Chloride: 100 mEq/L (ref 96–112)
Creatinine, Ser: 0.94 mg/dL (ref 0.40–1.20)
GFR: 69.2 mL/min (ref >60.00)
Glucose, Bld: 95 mg/dL (ref 70–99)
Potassium: 4.1 mEq/L (ref 3.5–5.1)
Sodium: 137 mEq/L (ref 135–145)
Total Bilirubin: 1.3 mg/dL — ABNORMAL HIGH (ref 0.2–1.2)
Total Protein: 7 g/dL (ref 6.0–8.3)

## 2021-07-04 LAB — CBC
HCT: 43.7 % (ref 36.0–46.0)
Hemoglobin: 14.6 g/dL (ref 12.0–15.0)
MCHC: 33.3 g/dL (ref 30.0–36.0)
MCV: 93.9 fl (ref 78.0–100.0)
Platelets: 313 10*3/uL (ref 150.0–400.0)
RBC: 4.65 Mil/uL (ref 3.87–5.11)
RDW: 12.7 % (ref 11.5–15.5)
WBC: 8.2 10*3/uL (ref 4.0–10.5)

## 2021-07-04 LAB — POCT URINALYSIS DIPSTICK
Bilirubin, UA: NEGATIVE
Blood, UA: 10
Glucose, UA: NEGATIVE
Ketones, UA: NEGATIVE
Leukocytes, UA: NEGATIVE
Protein, UA: NEGATIVE
Spec Grav, UA: 1.015 (ref 1.010–1.025)
Urobilinogen, UA: 0.2 E.U./dL
pH, UA: 6.5 (ref 5.0–8.0)

## 2021-07-04 NOTE — Progress Notes (Addendum)
[facility] [address] [place] [address] Dept: [phone] Dept Fax: [phone]  Office Visit  Subjective:    Patient ID: [name], female    DOB: [birth year], 55 y.o..   MRN: [id number]  Chief Complaint  Patient presents with   Acute Visit    C/o having lower left abdomen pain/pressure radiating across to middle, urination frequency     History of Present Illness:  Patient is in today with a complaint of acute left lower quadrant pain. She notes that she had some increasing urinary frequency and a cloudy appearing urine over the past week. She had been in [place] with her father that was in the hospital. On her drive back yesterday, she noted gradually increasing pain in the left lower quadrant. She has continued to have some urinary frequency, though no dysuria. She has had a prior episode of diverticulitis. She has had prior TAH/BSO and cholecystectomy. She denies fever or N/V/D. She has no past history of nephrolithiasis.  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 10/13/2020   Diverticular disease of colon 09/19/2020   Fatty liver 09/19/2020   Cavernous hemangioma of liver 08/21/2019   Thyroid nodule 08/21/2019   Pure hypercholesterolemia 07/07/2019   Herpes simplex 07/07/2019   GERD (gastroesophageal reflux disease) 07/07/2019   Overactive bladder 07/07/2019   Postsurgical menopause 08/13/2009    Situational depression 11/03/2008   Allergic rhinitis 11/03/2008   Past Surgical History:  Procedure Laterality Date   ABDOMINAL HYSTERECTOMY     BLADDER SUSPENSION     CHOLECYSTECTOMY     LAPAROSCOPIC ENDOMETRIOSIS FULGURATION     3 separate surgeries   Family History  Problem Relation Age of Onset   Hyperlipidemia Mother    Hypertension Mother    Glaucoma Mother    Memory loss Mother        being evaluated for alzheimers   Diabetes Father    Hypertension Father    Cancer Father        Melanoma   Dementia Father    Colon polyps Sister    Cancer Sister        Skin   Parkinson's disease Maternal Grandfather    Other Son        PANDAS   Outpatient Medications Prior to Visit  Medication Sig Dispense Refill   Ascorbic Acid (VITAMIN C PO) Take by mouth.     B COMPLEX VITAMINS PO Take 1 tablet by mouth daily.     Cetirizine HCl (ZYRTEC PO) Take by mouth.     cholecalciferol (VITAMIN D) 25 MCG (1000 UT) tablet Take 1,000 Units by mouth daily.     estradiol (ESTRACE) 1 MG tablet Take 1 mg by mouth daily.     Fluticasone Propionate (FLONASE NA) Place into the nose.     Loratadine (CLARITIN PO) Take by mouth.     Zinc Acetate, Oral, (ZINC ACETATE PO) Take 1 tablet by mouth daily.     Calcium Carbonate (CALCIUM 500 PO) Take 1 tablet by mouth daily. (Patient  not taking: Reported on 07/04/2021)     No facility-administered medications prior to visit.   Allergies  Allergen Reactions   Ciprofloxacin Hives and Nausea And Vomiting   Codeine    Doxycycline Itching   Iodine    Penicillins     REACTION: hives   Sulfonamide Derivatives     REACTION: rash    Objective:   Today's Vitals   07/04/21 1038  BP: 116/74  Pulse: 88  Temp: 97.7 F (36.5 C)  SpO2: 99%  Weight: 156 lb 9.6 oz (71 kg)   Body mass index is 23.13 kg/m.   General: Well developed, well nourished. No acute distress. Abdomen: Soft, Moderate tenderness in the LLQ radiating to the suprapubic area. Bowel  sounds positive, normal pitch   and frequency. No hepatosplenomegaly. No rebound. Mild guarding. Back: Straight. No CVA tenderness bilaterally. Psych: Alert and oriented. Normal mood and affect.  Health Maintenance Due  Topic Date Due   TETANUS/TDAP  Never done   Zoster Vaccines- Shingrix (1 of 2) Never done   PAP SMEAR-Modifier  01/11/2018   INFLUENZA VACCINE  03/13/2021     Lab Results: Component Ref Range & Units 10:41 4 mo ago 2 yr ago 10 yr ago 14 yr ago  Color, UA  yellow  yellow      Clarity, UA  clear  clear      Glucose, UA Negative Negative  Negative      Bilirubin, UA  neg  neg      Ketones, UA  neg  neg      Spec Grav, UA 1.010 - 1.025 1.015  1.015      Blood, UA  10 ery/ul  neg      pH, UA 5.0 - 8.0 6.5  6.0      Protein, UA Negative Negative  Negative      Urobilinogen, UA 0.2 or 1.0 E.U./dL 0.2  negative Abnormal    1.0 R  0.2 R   Nitrite, UA   neg      Leukocytes, UA Negative Negative  Negative       Assessment & Plan:   1. Acute left lower quadrant pain The etiology of the left lower quadrant pain is uncertain. Ms. Papandrea is afebrile with normal vitals. She has a small amount of blood in the urine. I will check labs to assess for possible causes and attempt to get an urgent CT renal stone study. I recommend she hydrate well and try taking ibuprofen for pain.  - POCT Urinalysis Dipstick - Urinalysis w microscopic + reflex cultur - Comprehensive metabolic panel - CBC - CT RENAL STONE STUDY; Future  Haydee Salter, MD  Addendum: CBC Latest Ref Rng & Units 07/04/2021 01/18/2021 11/01/2020  WBC 4.0 - 10.5 K/uL 8.2 6.5 4.6  Hemoglobin 12.0 - 15.0 g/dL 14.6 15.7 15.4  Hematocrit 36.0 - 46.0 % 43.7 46.4 44.9  Platelets 150.0 - 400.0 K/uL 313.0 326 275   CMP Latest Ref Rng & Units 07/04/2021 01/18/2021 10/13/2020  Glucose 70 - 99 mg/dL 95 87 79  BUN 6 - 23 mg/dL 14 18 15   Creatinine 0.40 - 1.20 mg/dL 0.94 0.90 0.84  Sodium 135 - 145 mEq/L 137 139 139  Potassium  3.5 - 5.1 mEq/L 4.1 4.4 4.2  Chloride 96 - 112 mEq/L 100 99 100  CO2 19 - 32 mEq/L 32 25 26  Calcium 8.4 - 10.5 mg/dL 9.6 10.0 10.1  Total Protein 6.0 - 8.3 g/dL 7.0 7.7  7.5  Total Bilirubin 0.2 - 1.2 mg/dL 1.3(H) 1.1 0.9  Alkaline Phos 39 - 117 U/L 56 67 59  AST 0 - 37 U/L 14 22 19   ALT 0 - 35 U/L 12 19 15    Imaging: CT Renal Stone Study (07/10/2021) IMPRESSION: 1. Sigmoid diverticulosis with mild bowel wall thickening and fat stranding about the mid to distal sigmoid colon, this appearance generally similar to prior examination. Findings are consistent with mild recurrent acute diverticulitis or chronic sequelae. No evidence of perforation or abscess. 2. No evidence of urinary tract calculus or hydronephrosis.  1. Acute diverticulitis Called and discussed CT findings with MS. Stimson. Her symptoms remain about the same. I will add antibiotics. We will go with Keflex and Flagyl due to her multiple antibiotic allergies. I recommend she keep her diet light and hydrate well. She should follow-up with Dr. Yong Channel within a week.  - metroNIDAZOLE (FLAGYL) 500 MG tablet; Take 1 tablet (500 mg total) by mouth 3 (three) times daily for 7 days.  Dispense: 21 tablet; Refill: 0 - cephALEXin (KEFLEX) 500 MG capsule; Take 1 capsule (500 mg total) by mouth 4 (four) times daily.  Dispense: 28 capsule; Refill: 0

## 2021-07-05 ENCOUNTER — Telehealth: Payer: Self-pay

## 2021-07-05 ENCOUNTER — Ambulatory Visit: Payer: BC Managed Care – PPO | Admitting: Family

## 2021-07-05 DIAGNOSIS — R1032 Left lower quadrant pain: Secondary | ICD-10-CM

## 2021-07-05 LAB — URINALYSIS W MICROSCOPIC + REFLEX CULTURE
Bacteria, UA: NONE SEEN /HPF
Bilirubin Urine: NEGATIVE
Glucose, UA: NEGATIVE
Hgb urine dipstick: NEGATIVE
Hyaline Cast: NONE SEEN /LPF
Ketones, ur: NEGATIVE
Leukocyte Esterase: NEGATIVE
Nitrites, Initial: NEGATIVE
Protein, ur: NEGATIVE
Specific Gravity, Urine: 1.015 (ref 1.001–1.035)
WBC, UA: NONE SEEN /HPF (ref 0–5)
pH: 7 (ref 5.0–8.0)

## 2021-07-05 LAB — NO CULTURE INDICATED

## 2021-07-05 MED ORDER — HYDROCODONE-ACETAMINOPHEN 10-325 MG PO TABS
1.0000 | ORAL_TABLET | Freq: Three times a day (TID) | ORAL | 0 refills | Status: AC | PRN
Start: 2021-07-05 — End: 2021-07-10

## 2021-07-05 NOTE — Telephone Encounter (Signed)
Patient called states that she was unable to get in for that CT scan today and they scheduled her for 08/09/21.  She is in a lot of pain and is wanting to know if you can call her in something.   Please review and advise.  Thanks.  Dm/cma

## 2021-07-05 NOTE — Telephone Encounter (Signed)
Patient notified VIA phone. No further questions. Dm/cma  

## 2021-07-10 ENCOUNTER — Ambulatory Visit
Admission: RE | Admit: 2021-07-10 | Discharge: 2021-07-10 | Disposition: A | Discharge status: Home / Self Care | Payer: BC Managed Care – PPO | Source: Ambulatory Visit | Attending: Family Medicine | Admitting: Family Medicine

## 2021-07-10 ENCOUNTER — Telehealth: Payer: Self-pay | Admitting: Family Medicine

## 2021-07-10 DIAGNOSIS — R1032 Left lower quadrant pain: Secondary | ICD-10-CM

## 2021-07-10 MED ORDER — CEPHALEXIN 500 MG PO CAPS: 500.0000 mg | ORAL_CAPSULE | Freq: Four times a day (QID) | ORAL | 0 refills | Status: DC

## 2021-07-10 MED ORDER — METRONIDAZOLE 500 MG PO TABS: 500.0000 mg | ORAL_TABLET | Freq: Three times a day (TID) | ORAL | 0 refills | Status: DC

## 2021-07-10 NOTE — Telephone Encounter (Signed)
Noted. Dm/cma  

## 2021-07-10 NOTE — Addendum Note (Signed)
Addended by: Haydee Salter on: 07/10/2021 02:33 PM   Modules accepted: Orders, Level of Service

## 2021-07-10 NOTE — Telephone Encounter (Signed)
Radiology called saying they have urgent findings from today...  CB: [phone]

## 2021-08-22 ENCOUNTER — Other Ambulatory Visit: Payer: Self-pay

## 2021-08-22 ENCOUNTER — Ambulatory Visit: Payer: BC Managed Care – PPO | Admitting: Neurology

## 2021-08-22 ENCOUNTER — Encounter: Payer: Self-pay | Admitting: Neurology

## 2021-08-22 VITALS — BP 105/78 | HR 77 | Ht 69.0 in | Wt 158.0 lb

## 2021-08-22 DIAGNOSIS — M5481 Occipital neuralgia: Secondary | ICD-10-CM | POA: Diagnosis not present

## 2021-08-22 DIAGNOSIS — M5412 Radiculopathy, cervical region: Secondary | ICD-10-CM

## 2021-08-22 DIAGNOSIS — G44221 Chronic tension-type headache, intractable: Secondary | ICD-10-CM

## 2021-08-22 DIAGNOSIS — D1803 Hemangioma of intra-abdominal structures: Secondary | ICD-10-CM

## 2021-08-22 NOTE — Progress Notes (Deleted)
Lidocaine 2%- 16mL total Lot: [id number] Expiration: 06/2018 NDC: 84037-543-60  Bupivacaine 0.5%- 62mL total Lot: [id number] Expiration: 03/13/2017 NDC: 6770-3403-52  Depo-medrol: 64mL total  Lot: [id number] Expiration: 05/2016 NDC: 0931-1216-24

## 2021-08-22 NOTE — Progress Notes (Addendum)
°  ° ° ° ° °  Bupivicaine injection/Depo-medrol/ Lidocaine protocol for occipital neuralgia  Bupivacaine 0.5% Depro-medrol 80 mg/68ml/ Lidocaine 2% 1::1 mixture was injected on the scalp  at several locations:  -On the occipital area of the head, 3 injections Bilaterally, 1 cc per injection at the midpoint between the mastoid process and the occipital protuberance. 2 other injections were done one finger breadth from the initial injection, one at a 10 o'clock position and the other at a 2 o'clock position.  The patient tolerated the injections well, no complications of the procedure were noted. Injections were made with a 27-gauge needle. Consent reviewed and signed.  Lidocaine 2%- 73mL total Lot: [id number] Expiration: 2/2024NDC: 52174-715-95   Bupivacaine 0.5%- 65mL total Lot: [id number] Expiration:08/2024 NDC: 79150-413-64   Depo-medrol: 12mL total  Lot: [id number] Expiration: 05/2023 NDC: [phone]  Ward Givens NP-C  08/23/2021 12:00PM

## 2021-08-22 NOTE — Progress Notes (Signed)
Provider:  Larey Trujillo, M D  Referring Provider: Marin Olp, MD Primary Care Physician:  Heather Olp, MD  Chief Complaint  Patient presents with   New Patient (Initial Visit)    Pt alone, rm 11. Presents today for January 2022 infection with COVID- post covid symptoms :She describes brain fog and slow responses, ringing of the ears (which ENT eval) c/o of pain and head feels heavy posterior head between the ears. RM 10, alone. Last seen 01/18/21.  Still having occiptial nerve pain that radiates down the neck to the right   Vision issues still occurring. Switched from contacts to [place].  Has seen opthalmoogist.  Tinnitus still present. Having brain fog/vertigo. Has not been back to work since 08/2020. Cardiologist recommended long haul covid clinic and referred her. However, they declined referral , stating she has already done everything they would recommend as far as specialists she has seen. Referred back to PCP.  She reports vertigo and tinnitus were worse again after a flight to [place] clinic where her son  is treated.     HPI: 08-22-2021:  [name] is a 56 y.o. female patient and seen in a RV for long Covid.  She has tinnitus, eye pain, and joint pain. All started after Covid infection. ENT work up- audiology, was negative. [facility] tinnitus therapy was requiring her to buy a special sound machine with blue tooth connected pillow- too expensive , she feels.  NO true Vertigo- had never seen Vestibular rehab. Feels as if she is on a rocking boat.  Tension and triggering pain behind the right ear, radiating to the neck.  She is anxious, but I think she should return to work, have the computer desk hight adjusted, and get the best position for her needs.    01-18-2021: Her MRI brain was normal, her MOCA had been 27/ 30 , today's MMSE was 26/30- she feels as if her cognitive processing is improving but still thinking is slower - she noted  During a grocery  store visit that she had  Left the store before she got all items she went for.    The patient was seen last 10-13-2020 upon referral from [place]. [name] felt overall healthy and well until she contracted COVID on January 11 at a funeral. There were several close friends attending, there was at least one family member that was Covid positive among the other parties but the relatives did not attend the funeral with masks. Several people seem to have been affected during visitation earlier that week and [name] had symptoms with an 3 to 5 days after the funeral. She tested positive for the infection as her main symptoms were sinus pressure, she did receive an antibiotic which helped with clearing some of that up but she developed tinnitus tinnitus, headache and neck stiffness and she lost taste and smell which is not that common with the omicron -variant. Some of the other guests had coughing, fatigue, chills. Her main concern is brain fog - and loss of smell. She may have contracted delta?  She is lightheaded, yet feels she is all slowed.   Todays concern is for memory and smell loss-  She can smell lysol, which she sprays generously everywhere.  She had smelled things that weren't there , smoke and excrements-  Lost taste for citrus.   10-13-20: [name]- " menthol" - finally got it right. Coconut- yes.  Almond- yes Vanilla - yes peppermint-  yes [place]- 'Orange' . Clove- no- "cinnamon" -nutmeg- unpleasant to her.     Review of Systems: Out of a complete 14 system review, the patient complains of only the following symptoms, and all other reviewed systems are negative. Fatigue , post viral.  Photosensitivity, dry eyes.  reports palpitations at night VERTIGO  Parosmia hoarseness- may be from sinus , may be from lysol spray. Orthostatic- repeated today.  Semi functioning [place] 19.   Social History   Socioeconomic History   Marital status: Married     Spouse name: Not on file   Number of children: Not on file   Years of education: Not on file   Highest education level: Not on file  Occupational History   Not on file  Tobacco Use   Smoking status: Never   Smokeless tobacco: Never  Vaping Use   Vaping Use: Never used  Substance and Sexual Activity   Alcohol use: Yes    Comment: social- less than 1 a week   Drug use: Never   Sexual activity: Yes    Birth control/protection: Surgical  Other Topics Concern   Not on file  Social History Narrative   Lives at home with spouse. [name] 23 and [name] 19  at home in 2022.       Disabled 2022 from [place] covid- had been doing temp prior to long covid      Hobbies: enjoys watching thomas play basketball, beach, time with friends, shopping      Right handed   Caffeine: no   Social Determinants of Health   Financial Resource Strain: Not on file  Food Insecurity: Not on file  Transportation Needs: Not on file  Physical Activity: Not on file  Stress: Not on file  Social Connections: Not on file  Intimate Partner Violence: Not on file    Family History  Problem Relation Age of Onset   Hyperlipidemia Mother    Hypertension Mother    Glaucoma Mother    Memory loss Mother        being evaluated for alzheimers   Diabetes Father    Hypertension Father    Cancer Father        Melanoma   Dementia Father    Colon polyps Sister    Cancer Sister        Skin   Parkinson's disease Maternal Grandfather    Other Son        PANDAS    Past Medical History:  Diagnosis Date   Allergy    Coronary artery calcification    CT 07/2019: Ca score 3   Diverticulosis    hx of diverticulitis    Echocardiogram 12/2020   Echo 5/22: EF 60-65, no RWMA, GLS -21.0%, normal RVSF, mild MR, no pericardial effusion   GERD (gastroesophageal reflux disease)    History of chickenpox    Hyperlipidemia    Wears eyeglasses    dry eyes and cornea change     Past Surgical History:  Procedure Laterality  Date   ABDOMINAL HYSTERECTOMY     BLADDER SUSPENSION     CHOLECYSTECTOMY     LAPAROSCOPIC ENDOMETRIOSIS FULGURATION     3 separate surgeries    Current Outpatient Medications  Medication Sig Dispense Refill   Ascorbic Acid (VITAMIN C PO) Take by mouth.     B COMPLEX VITAMINS PO Take 1 tablet by mouth daily.     Calcium Carbonate (CALCIUM 500 PO) Take 1 tablet by mouth daily.  Cetirizine HCl (ZYRTEC PO) Take by mouth.     cholecalciferol (VITAMIN D) 25 MCG (1000 UT) tablet Take 1,000 Units by mouth daily.     COENZYME Q10-OMEGA 3 FATTY ACD PO Take by mouth.     estradiol (ESTRACE) 1 MG tablet Take 1 mg by mouth daily.     Ferrous Sulfate (IRON PO) Take by mouth. Has copper in this as well     Fluticasone Propionate (FLONASE NA) Place into the nose.     Lifitegrast (XIIDRA) 5 % SOLN Apply to eye.     Loratadine (CLARITIN PO) Take by mouth.     MAGNESIUM PO Take by mouth.     Zinc Acetate, Oral, (ZINC ACETATE PO) Take 1 tablet by mouth daily.     No current facility-administered medications for this visit.    Allergies as of 08/22/2021 - Review Complete 08/22/2021  Allergen Reaction Noted   Ciprofloxacin Hives and Nausea And Vomiting 09/22/2018   Codeine  07/07/2019   Doxycycline Itching 08/15/2015   Iodine  07/07/2019   Penicillins     Sulfonamide derivatives      Vitals: BP 105/78 (BP Location: Left Arm, Patient Position: Sitting, Cuff Size: Normal)    Pulse 77    Ht 5\' 9"  (1.753 m)    Wt 158 lb (71.7 kg)    SpO2 99%    BMI 23.33 kg/m    Orthostatic VS for the past 72 hrs (Last 3 readings):  Patient Position BP Location  01/18/21 1116 Standing Left Arm  121/81 with regular HR of 82 bpm.     Last Weight:  Wt Readings from Last 1 Encounters:  08/22/21 158 lb (71.7 kg)   Last Height:   Ht Readings from Last 1 Encounters:  08/22/21 5\' 9"  (1.753 m)     Physical exam:  General: The patient is awake, alert and appears fatigued, headache , dry eyes.  The patient  is well groomed. Head: Normocephalic, atraumatic. Neck is supple. Mallampati , neck circumference: 15 Cardiovascular:  Regular rate and rhythm- reports palpitations at night- , without  murmurs or carotid bruit, and without distended neck veins. Respiratory: Lungs are clear to auscultation. Skin:  Without evidence of edema, or rash- red eyes. Now with glasses, no longer using contacts . Trunk: BMI is 22.56 kg/m2.   Neurologic exam : The patient is awake and alert, oriented to place and time.  Memory subjective impaired-. There is a normal attention span & concentration ability.  Speech is fluent without dysarthria, but with  dysphonia and subjective  Aphasia.  Mood and affect are depressed, worried.Jenetta Loges Cognitive Assessment  10/13/2020  Visuospatial/ Executive (0/5) 5  Naming (0/3) 3  Attention: Read list of digits (0/2) 2  Attention: Read list of letters (0/1) 1  Attention: Serial 7 subtraction starting at 100 (0/3) 3  Language: Repeat phrase (0/2) 1  Language : Fluency (0/1) 1  Abstraction (0/2) 2  Delayed Recall (0/5) 3  Orientation (0/6) 6  Total 27   MMSE - Mini Mental State Exam 01/18/2021  Orientation to time 4  Orientation to Place 5  Registration 3  Attention/ Calculation 4  Recall 1  Language- name 2 objects 2  Language- repeat 1  Language- follow 3 step command 3  Language- read & follow direction 1  Write a sentence 1  Copy design 1  Total score 26     Cranial nerves: smell and taste impairment.  Pupils are equal and briskly reactive to  light. Funduscopic exam without  evidence of pallor or edema. Extraocular movements  in vertical and horizontal planes intact and without nystagmus.  Visual fields by finger perimetry are intact. Hearing to finger rub intact.  Facial sensation intact to fine touch. Facial motor strength is symmetric and tongue and uvula move midline. Tongue protrusion into either cheek is normal. Shoulder shrug is normal.   Motor exam:   has normal tone ,muscle bulk and symmetric  strength in all extremities. Sensory:  Fine touch, pinprick and vibration were tested in all extremities. Proprioception was normal. Coordination: Rapid alternating movements in the fingers/hands were normal. Finger-to-nose maneuver normal without evidence of ataxia, dysmetria or tremor. Gait and station: Patient walks without assistive device and is able unassisted to climb up to the exam table. Strength within normal limits. Stance is stable and normal. Tandem gait is unfragmented. Romberg testing is negative  Deep tendon reflexes: in the  upper and lower extremities are symmetric and intact. Babinski maneuver response is  downgoing.   Assessment:  After physical and neurologic examination, review of laboratory studies, imaging, neurophysiology testing and pre-existing records, assessment is that of :  Anosmia, improved - and taste , too.  Reports increased  feeling of dry eyes, sinus pressure and headache.  She is anxious and feels impaired cognitively, physically, has palpitations. Cardiac monitoring, 30 days, and EKG was normal. Tension neck and occipital headaches and sinus headaches were addressed: .    Plan:  Treatment plan and additional workup : Photophobia, may be a form of migraine - Topiramate has helped. She stopped it meanwhile.   Tension neck and occipital headaches- more of an occipital neuralgia? Massage helped, some pain radiated to the right neck a into shoulder- radiculopathy possible/ need CT neck spine. NCV.    Atypical Vertigo , more the feeling of being on a rocking boat- prn low dose xanax. She may benefit form a vestibular rehab evaluation.   Will get occipital injection- trigger point   Slow increase in daily activity has been well tolerated- can go back to work.  RV prn after she sees PCP.    Heather Partridge Janasia Coverdale MD 08/22/2021

## 2021-08-25 ENCOUNTER — Telehealth: Payer: Self-pay | Admitting: Neurology

## 2021-08-25 NOTE — Telephone Encounter (Signed)
[employer] Heather KaufmannMarily Lente (exp. 08/25/21 to 09/23/21) patient is scheduled at GI for 09/13/21.

## 2021-09-12 ENCOUNTER — Telehealth: Payer: Self-pay | Admitting: Neurology

## 2021-09-12 NOTE — Telephone Encounter (Signed)
Called patient to see if she would like to move up her appt for NCV/EMG as we had an opening for tomorrow. She did not accept this and wanted to keep her original appt. She also asked what all this test consists of-which I explained the process of how NCV/EMG works. She said that she is still having pain and swelling where she had her injections done here. Patient asked if this was normal-I let her know I am not clinical and would have to refer to our clinical staff to answer this for her.  Stated she is going to do more research on NCV/EMG but would like someone to give her a call back regarding pain and swelling at injection sites.

## 2021-09-12 NOTE — Telephone Encounter (Signed)
I can certainly take a look at the sites and let her know what I think.

## 2021-09-12 NOTE — Telephone Encounter (Signed)
Called the patient she states that she has decreased sensation on the right but still has sensation on the left. She states that even her hair stylist noticed swelling and knot on head. Advised that Jinny Blossom offered for the pt to pop in and she would look at it sites. Pt agreed to come by thur around 11:45 am.  Pt proceeded to ask me questions in regards to the nerve conduction study. I advised the purpose of the test and brief over view of what the test looks like. She began to ask more questions that I didn't know the answer to. I advised that maybe I can let the nerve conduction technician know her concern and see if she can answer her questions or concerns. Pt verbalized understanding and was appreciative

## 2021-09-13 ENCOUNTER — Other Ambulatory Visit: Payer: Self-pay

## 2021-09-13 ENCOUNTER — Ambulatory Visit
Admission: RE | Admit: 2021-09-13 | Discharge: 2021-09-13 | Disposition: A | Discharge status: Home / Self Care | Payer: BC Managed Care – PPO | Source: Ambulatory Visit | Attending: Neurology | Admitting: Neurology

## 2021-09-13 DIAGNOSIS — M5481 Occipital neuralgia: Secondary | ICD-10-CM

## 2021-09-13 DIAGNOSIS — M5412 Radiculopathy, cervical region: Secondary | ICD-10-CM

## 2021-09-13 NOTE — Telephone Encounter (Signed)
I have reached out to patient via [place].

## 2021-09-14 ENCOUNTER — Other Ambulatory Visit: Payer: Self-pay | Admitting: Neurology

## 2021-09-14 MED ORDER — METHYLPREDNISOLONE 4 MG PO TBPK: ORAL_TABLET | ORAL | 0 refills | Status: DC

## 2021-09-19 ENCOUNTER — Encounter: Payer: Self-pay | Admitting: Neurology

## 2021-09-19 NOTE — Progress Notes (Signed)
Kindly inform the patient that MRI scan of the cervical spine shows only minor wear-and-tear changes but no definite pinched nerve to explain her headache and neck pain

## 2021-10-11 ENCOUNTER — Encounter: Payer: BC Managed Care – PPO | Admitting: Neurology

## 2021-11-07 ENCOUNTER — Other Ambulatory Visit: Payer: Self-pay

## 2021-11-07 ENCOUNTER — Encounter: Payer: BC Managed Care – PPO | Admitting: Neurology

## 2021-11-07 ENCOUNTER — Encounter: Payer: Self-pay | Admitting: Neurology

## 2021-11-07 ENCOUNTER — Ambulatory Visit (INDEPENDENT_AMBULATORY_CARE_PROVIDER_SITE_OTHER): Payer: BC Managed Care – PPO | Admitting: Neurology

## 2021-11-07 DIAGNOSIS — M5412 Radiculopathy, cervical region: Secondary | ICD-10-CM | POA: Diagnosis not present

## 2021-11-07 DIAGNOSIS — M5481 Occipital neuralgia: Secondary | ICD-10-CM

## 2021-11-07 DIAGNOSIS — Z0289 Encounter for other administrative examinations: Secondary | ICD-10-CM

## 2021-11-07 NOTE — Progress Notes (Signed)
This is a normal study of the right upper extremity and neck .  There is no electrodiagnostic evidence of right upper extremity neuropathy or right cervical radiculopathy.

## 2021-11-07 NOTE — Procedures (Signed)
? ? ? ?   ?  Full Name: [name] Gender: Female ?MRN #: [id number] Date of Birth: 30-Jun-1966 ?   ?Visit Date: 11/07/2021 07:22 ?Age: 56 Years ?Examining Physician: Marcial Pacas, MD  ?Referring Physician: Larey Seat, MD ?History: 56 year old female complains of neck pain, radiating pain to bilateral shoulder following COVID infection in January 2022 ? ?Summary of the test ? ?Nerve conduction study: ? ?Right median, ulnar sensory and motor responses were normal. ? ?Electromyography: ? ?Selected needle examination of right upper extremity muscles, right cervical paraspinal muscles were normal. ? ? ?Conclusion: ?This is a normal study.  There is no electrodiagnostic evidence of right upper extremity neuropathy or right cervical radiculopathy. ? ? ? ?------------------------------- ?Catalina Pizza.D.Ph.D. ? ?[facility] ?[address], [address] ?[place], [place] [address] ?Tel: [phone] ?Fax: [phone] ? ?Verbal informed consent was obtained from the patient, patient was informed of potential risk of procedure, including bruising, bleeding, hematoma formation, infection, muscle weakness, muscle pain, numbness, among others. ?   ? ?   ?[place] ?   ?Nerve / Sites Muscle Latency Ref. Amplitude Ref. Rel Amp Segments Distance Velocity Ref. Area  ?  ms ms mV mV %  cm m/s m/s mVms  ?R Median - APB  ?   Wrist APB 3.2 ?4.4 12.3 ?4.0 100 Wrist - APB 7   52.9  ?   Upper arm APB 7.0  12.1  98.2 Upper arm - Wrist 21 55 ?49 50.9  ?R Ulnar - ADM  ?   Wrist ADM 2.7 ?3.3 15.3 ?6.0 100 Wrist - ADM 7   52.8  ?   B.Elbow ADM 5.6  15.1  98.5 B.Elbow - Wrist 18 61 ?49 51.4  ?   A.Elbow ADM 7.3  14.5  96 A.Elbow - B.Elbow 10 61 ?49 50.5  ?       ?[place] ?   ?Nerve / Sites Rec. Site Peak Lat Ref.  Amp Ref. Segments Distance  ?  ms ms ?V ?V  cm  ?R Median - Orthodromic (Dig II, Mid palm)  ?   Dig II Wrist 2.9 ?3.4 22 ?10 Dig II - Wrist 13  ?R Ulnar - Orthodromic, (Dig V, Mid palm)  ?   Dig V Wrist 2.5 ?3.1 11 ?5 Dig V - Wrist  11  ?       ?F  Wave ?   ?Nerve F Lat Ref.  ? ms ms  ?R Ulnar - ADM 27.4 ?32.0  ?     ?EMG Summary Table   ? Spontaneous MUAP Recruitment  ?Muscle IA Fib PSW Fasc Other Amp Dur. Poly Pattern  ?R. First dorsal interosseous Normal None None None _______ Normal Normal Normal Normal  ?R. Pronator teres Normal None None None _______ Normal Normal Normal Normal  ?R. Flexor digitorum profundus (Ulnar) Normal None None None _______ Normal Normal Normal Normal  ?R. Extensor digitorum communis Normal None None None _______ Normal Normal Normal Normal  ?R. Triceps brachii Normal None None None _______ Normal Normal Normal Normal  ?R. Deltoid Normal None None None _______ Normal Normal Normal Normal  ?R. Biceps brachii Normal None None None _______ Normal Normal Normal Normal  ?R. Cervical paraspinals Normal None None None _______ Normal Normal Normal Normal  ? ?  ?

## 2021-12-06 ENCOUNTER — Encounter: Payer: BC Managed Care – PPO | Admitting: Neurology

## 2022-02-19 ENCOUNTER — Encounter: Payer: BC Managed Care – PPO | Admitting: Family Medicine

## 2022-02-22 ENCOUNTER — Ambulatory Visit: Payer: BC Managed Care – PPO | Admitting: Adult Health

## 2022-03-14 ENCOUNTER — Encounter: Payer: Self-pay | Admitting: Physician Assistant

## 2022-03-14 ENCOUNTER — Ambulatory Visit (INDEPENDENT_AMBULATORY_CARE_PROVIDER_SITE_OTHER): Payer: BC Managed Care – PPO | Admitting: Physician Assistant

## 2022-03-14 VITALS — BP 90/60 | HR 80 | Temp 97.8°F | Ht 69.0 in | Wt 153.4 lb

## 2022-03-14 DIAGNOSIS — J029 Acute pharyngitis, unspecified: Secondary | ICD-10-CM | POA: Diagnosis not present

## 2022-03-14 DIAGNOSIS — R0981 Nasal congestion: Secondary | ICD-10-CM

## 2022-03-14 DIAGNOSIS — B009 Herpesviral infection, unspecified: Secondary | ICD-10-CM

## 2022-03-14 LAB — POCT RAPID STREP A (OFFICE): Rapid Strep A Screen: NEGATIVE

## 2022-03-14 MED ORDER — VALACYCLOVIR HCL 1 G PO TABS: ORAL_TABLET | ORAL | 0 refills | Status: DC

## 2022-03-14 MED ORDER — AZITHROMYCIN 250 MG PO TABS: ORAL_TABLET | ORAL | 0 refills | Status: AC

## 2022-03-14 NOTE — Progress Notes (Signed)
[name] is a 56 y.o. female here for a new problem.  History of Present Illness:   Chief Complaint  Patient presents with   Sinus Problem    Pt c/o glands are swollen , throat hurts since Thursday. Facial pain, ears hurting and teeth hurting. Woke up this morning with fever blister top lip.    HPI  Sinus issue Patient complain of swollen glands for the past 7 days. Associated with facial pain, sore throat, ear pain, teeth pain. States she went to beach last Thursday and her symptoms started with scratchy throat. Has noticed fever blister on her lip. Had sick contacts with her son. She has tried Claritin, Advil and Zyrtec with no relief. She does admit she gets sinus infection in similar presentations. No other specific treatment tried. No cough. No fever or chills. Denies chest pain, chest tightness, shortness of breath, n/v, abdominal pain, diarrhea. Denies any other aggravating or relieving factors.  Herpes simplex Hx of cold sores. Has not had one in at least 10 years. Used to treat with valtrex but doesn't have any. Has swelling in her glands on this side as well.  Past Medical History:  Diagnosis Date   Allergy    Coronary artery calcification    CT 07/2019: Ca score 3   Diverticulosis    hx of diverticulitis    Echocardiogram 12/2020   Echo 5/22: EF 60-65, no RWMA, GLS -21.0%, normal RVSF, mild MR, no pericardial effusion   GERD (gastroesophageal reflux disease)    History of chickenpox    Hyperlipidemia    Wears eyeglasses    dry eyes and cornea change      Social History   Tobacco Use   Smoking status: Never   Smokeless tobacco: Never  Vaping Use   Vaping Use: Never used  Substance Use Topics   Alcohol use: Yes    Comment: social- less than 1 a week   Drug use: Never    Past Surgical History:  Procedure Laterality Date   ABDOMINAL HYSTERECTOMY     BLADDER SUSPENSION     CHOLECYSTECTOMY     LAPAROSCOPIC ENDOMETRIOSIS FULGURATION     3 separate  surgeries    Family History  Problem Relation Age of Onset   Hyperlipidemia Mother    Hypertension Mother    Glaucoma Mother    Memory loss Mother        being evaluated for alzheimers   Diabetes Father    Hypertension Father    Cancer Father        Melanoma   Dementia Father    Colon polyps Sister    Cancer Sister        Skin   Parkinson's disease Maternal Grandfather    Other Son        PANDAS    Allergies  Allergen Reactions   Ciprofloxacin Hives and Nausea And Vomiting   Codeine    Doxycycline Itching   Iodine    Penicillins     REACTION: hives   Sulfonamide Derivatives     REACTION: rash    Current Medications:   Current Outpatient Medications:    albuterol (VENTOLIN HFA) 108 (90 Base) MCG/ACT inhaler, Inhale 2 puffs into the lungs every 4 (four) hours as needed., Disp: , Rfl:    Ascorbic Acid (VITAMIN C PO), Take by mouth., Disp: , Rfl:    azithromycin (ZITHROMAX) 250 MG tablet, Take 2 tablets on day 1, then 1 tablet daily on days 2 through  5, Disp: 6 tablet, Rfl: 0   B COMPLEX VITAMINS PO, Take 1 tablet by mouth daily., Disp: , Rfl:    Calcium Carbonate (CALCIUM 500 PO), Take 1 tablet by mouth daily., Disp: , Rfl:    Cetirizine HCl (ZYRTEC PO), Take by mouth., Disp: , Rfl:    cholecalciferol (VITAMIN D) 25 MCG (1000 UT) tablet, Take 1,000 Units by mouth daily., Disp: , Rfl:    COENZYME Q10-OMEGA 3 FATTY ACD PO, Take by mouth., Disp: , Rfl:    estradiol (ESTRACE) 1 MG tablet, Take 1 mg by mouth daily., Disp: , Rfl:    Ferrous Sulfate (IRON PO), Take by mouth. Has copper in this as well, Disp: , Rfl:    Fluticasone Propionate (FLONASE NA), Place into the nose., Disp: , Rfl:    Lifitegrast (XIIDRA) 5 % SOLN, Apply to eye., Disp: , Rfl:    Loratadine (CLARITIN PO), Take by mouth., Disp: , Rfl:    MAGNESIUM PO, Take by mouth., Disp: , Rfl:    valACYclovir (VALTREX) 1000 MG tablet, Take two tablets ( total 2000 mg) by mouth q12h x 1 day; Start: ASAP after symptom  onset, Disp: 30 tablet, Rfl: 0   Zinc Acetate, Oral, (ZINC ACETATE PO), Take 1 tablet by mouth daily., Disp: , Rfl:    Review of Systems:   ROS Negative unless otherwise specified per HPI.   Vitals:   Vitals:   03/14/22 1131  BP: 90/60  Pulse: 80  Temp: 97.8 F (36.6 C)  TempSrc: Temporal  SpO2: 96%  Weight: 153 lb 6.1 oz (69.6 kg)  Height: '5\' 9"'$  (1.753 m)     Body mass index is 22.65 kg/m.  Physical Exam:   Physical Exam Vitals and nursing note reviewed.  Constitutional:      General: She is not in acute distress.    Appearance: She is well-developed. She is not ill-appearing or toxic-appearing.  HENT:     Head: Normocephalic and atraumatic.     Right Ear: Ear canal and external ear normal. A middle ear effusion is present. Tympanic membrane is not erythematous, retracted or bulging.     Left Ear: Ear canal and external ear normal. A middle ear effusion is present. Tympanic membrane is not erythematous, retracted or bulging.     Nose: Congestion present.     Right Sinus: No maxillary sinus tenderness or frontal sinus tenderness.     Left Sinus: No maxillary sinus tenderness or frontal sinus tenderness.     Mouth/Throat:     Lips: Pink.     Mouth: Mucous membranes are moist.     Pharynx: Uvula midline. Posterior oropharyngeal erythema present.     Tonsils: No tonsillar exudate.  Eyes:     General: Lids are normal.     Conjunctiva/sclera: Conjunctivae normal.  Neck:     Trachea: Trachea normal.  Cardiovascular:     Rate and Rhythm: Normal rate and regular rhythm.     Heart sounds: Normal heart sounds, S1 normal and S2 normal.  Pulmonary:     Effort: Pulmonary effort is normal.     Breath sounds: Normal breath sounds. No decreased breath sounds, wheezing, rhonchi or rales.  Lymphadenopathy:     Cervical: Cervical adenopathy present.     Right cervical: Superficial cervical adenopathy present.     Left cervical: Superficial cervical adenopathy present.  Skin:     General: Skin is warm and dry.  Neurological:     Mental Status: She is alert.  Psychiatric:        Speech: Speech normal.        Behavior: Behavior normal. Behavior is cooperative.    Results for orders placed or performed in visit on 03/14/22  POCT rapid strep A  Result Value Ref Range   Rapid Strep A Screen Negative Negative     Assessment and Plan:   Herpes simplex No red flags Refilled valtrex for prn use 2 g q 12 hr x 1 day Follow-up as needed  Sore throat; Sinus congestion No red flags Symptoms consistent with past sinus infections Strep test neg Start azithromycin Rest, hydrate, nsaids prn for pain/inflammation Follow-up if new/worsening sx  I,Savera Zaman,acting as a scribe for [employer], PA.,have documented all relevant documentation on the behalf of Inda Coke, PA,as directed by  Inda Coke, PA while in the presence of Inda Coke, [place].    I, Inda Coke, [place], have reviewed all documentation for this visit. The documentation on 03/14/22 for the exam, diagnosis, procedures, and orders are all accurate and complete.   Inda Coke, PA-C

## 2022-03-14 NOTE — Patient Instructions (Signed)
It was great to see you!  Start azithromycin for your sinus infection  Start valtrex for your cold sore  Hydrate and rest  Ibuprofen as needed for pain/inflammation  Take care,  Inda Coke PA-C

## 2022-04-02 LAB — HEPATIC FUNCTION PANEL
ALT: 11 U/L (ref 7–35)
AST: 16 (ref 13–35)
Alkaline Phosphatase: 68 (ref 25–125)
Bilirubin, Total: 0.8

## 2022-04-02 LAB — LIPID PANEL
Cholesterol: 297 — AB (ref 0–200)
HDL: 52 (ref 35–70)
LDL Cholesterol: 179
Triglycerides: 340 — AB (ref 40–160)

## 2022-04-02 LAB — COMPREHENSIVE METABOLIC PANEL
Albumin: 4 (ref 3.5–5.0)
Calcium: 9.4 (ref 8.7–10.7)
Globulin: 3
eGFR: 65

## 2022-04-02 LAB — BASIC METABOLIC PANEL
BUN: 25 — AB (ref 4–21)
CO2: 25 — AB (ref 13–22)
Chloride: 101 (ref 99–108)
Creatinine: 1 (ref 0.5–1.1)
Potassium: 4.1 mEq/L (ref 3.5–5.1)
Sodium: 139 (ref 137–147)

## 2022-04-02 LAB — CBC: RBC: 4.64 (ref 3.87–5.11)

## 2022-04-02 LAB — CBC AND DIFFERENTIAL
HCT: 45 (ref 36–46)
Hemoglobin: 14.6 (ref 12.0–16.0)
Platelets: 302 10*3/uL (ref 150–400)
WBC: 5.3

## 2022-04-02 LAB — HEMOGLOBIN A1C: Hemoglobin A1C: 5.5

## 2022-04-02 LAB — TSH: TSH: 1.17 (ref 0.41–5.90)

## 2022-04-03 ENCOUNTER — Other Ambulatory Visit: Payer: Self-pay

## 2022-04-03 ENCOUNTER — Encounter: Payer: Self-pay | Admitting: Family Medicine

## 2022-04-03 MED ORDER — ROSUVASTATIN CALCIUM 20 MG PO TABS: 20.0000 mg | ORAL_TABLET | Freq: Every day | ORAL | 3 refills | Status: DC

## 2022-04-21 IMAGING — CT CT CERVICAL SPINE W/O CM
2 series · 10 of 14 positions shown, 12 images · non-contrast
Comparison: Brain MRI 10/26/2020.

CLINICAL DATA: 55-year-old female with occipital nerve pain
following [id number] 1 year ago.



[Series 2: cspine soft · axial · 0.33mm/px · z∈[-247,-109]mm · 5 of 105 slices shown, 7 images]
[im 18/105  soft-tissue]
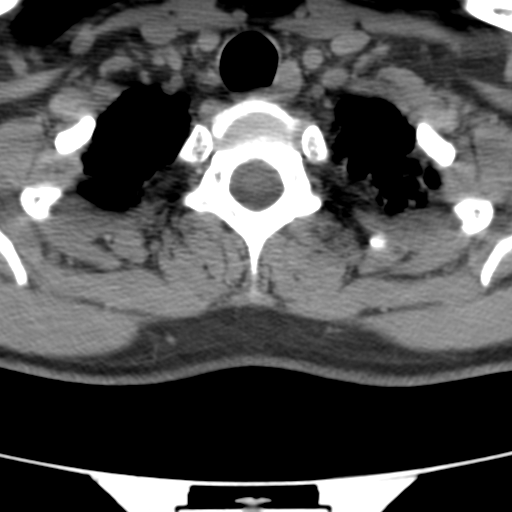
[im 18/105  bone]
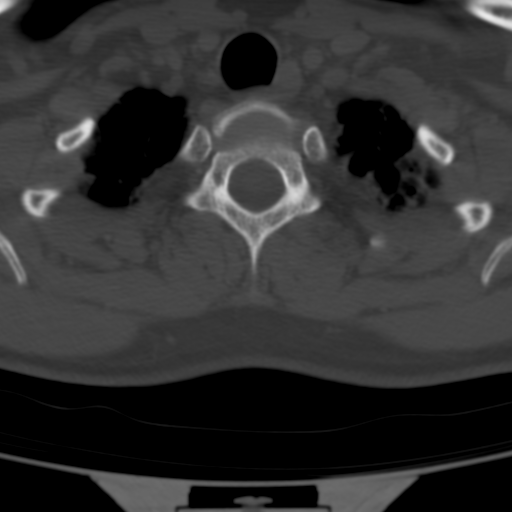
[im 35/105  bone]
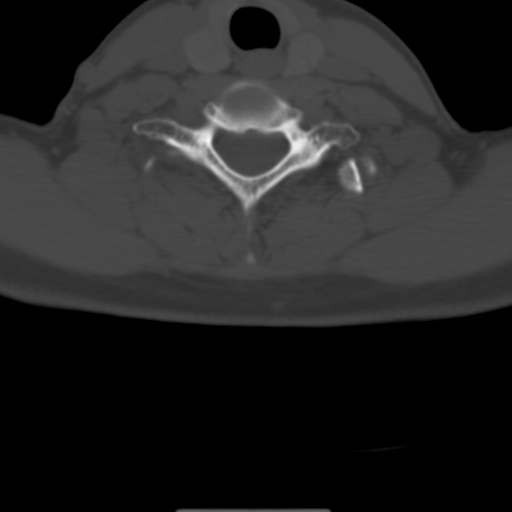
[im 53/105  bone]
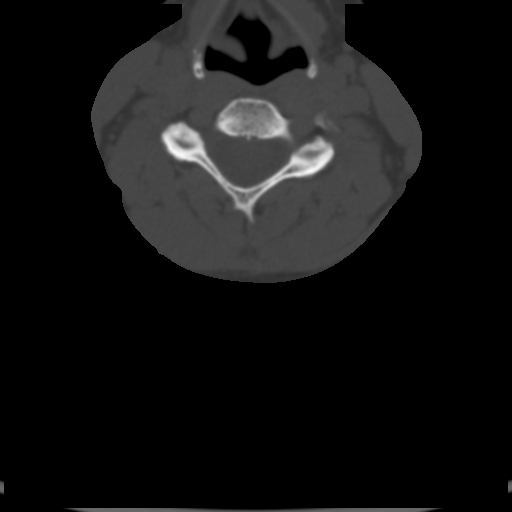
[im 70/105  bone]
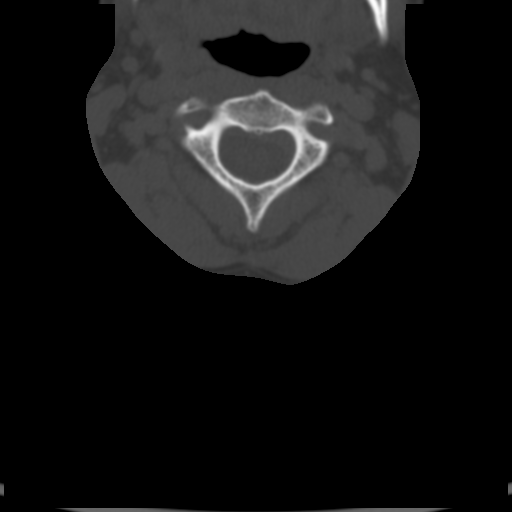
[im 87/105  soft-tissue]
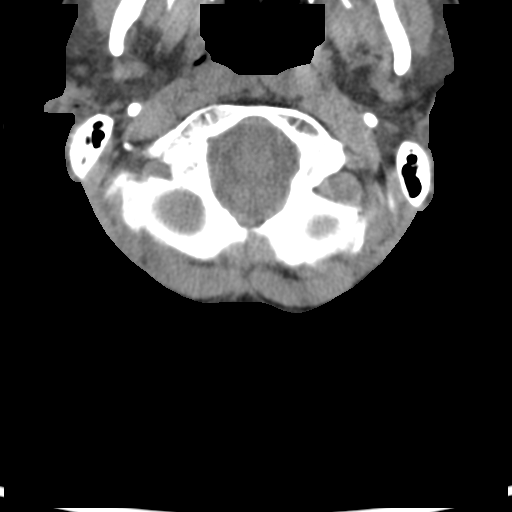
[im 87/105  bone]
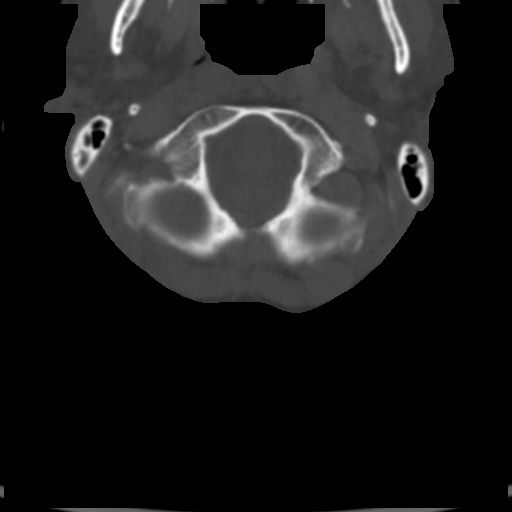

[Series 8: angled axial soft · axial · 0.25mm/px · z∈[-252,-118]mm · 5 of 103 slices shown]
[im 18/103  soft-tissue]
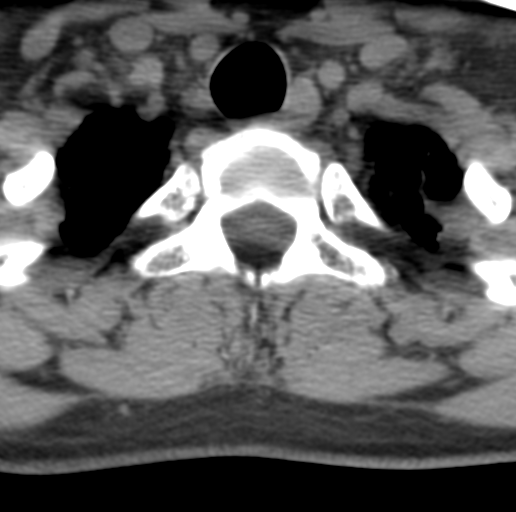
[im 35/103  soft-tissue]
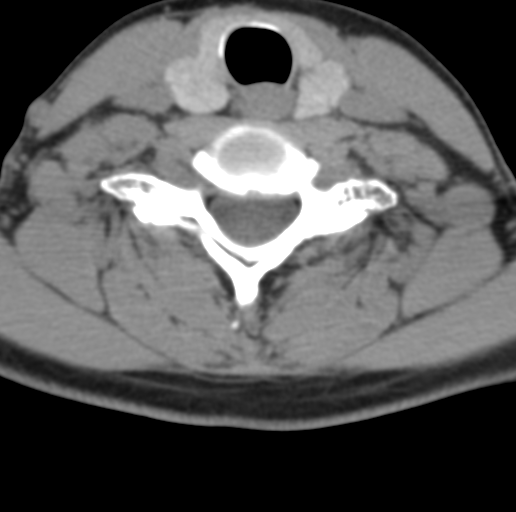
[im 52/103  soft-tissue]
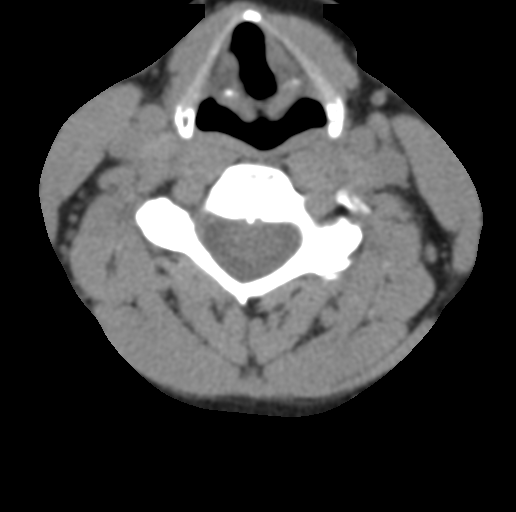
[im 69/103  soft-tissue]
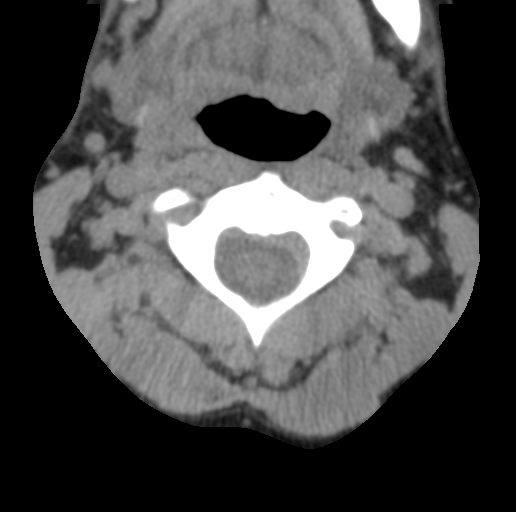
[im 86/103  soft-tissue]
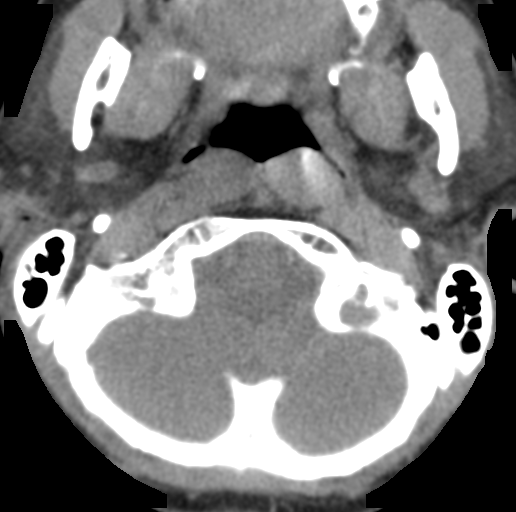

[10 of 14 positions shown; findings below may reference images not displayed]

FINDINGS: Alignment: Straightening and mild reversal of upper cervical
lordosis. Mild dextroconvex scoliosis. No significant
spondylolisthesis. Cervicothoracic junction alignment is within
normal limits. Bilateral posterior element alignment is within
normal limits.

Skull base and vertebrae: Visualized skull base is intact. No
atlanto-occipital dissociation. C1 and C2 appear intact and aligned.
No acute osseous abnormality identified.

Soft tissues and spinal canal: No prevertebral fluid or swelling. No
visible canal hematoma. Negative visible noncontrast posterior
fossa. Negative visible noncontrast neck soft tissues. Suboccipital
scalp soft tissues are unremarkable. Hyperplastic sphenoid sinuses.
Tympanic cavities and mastoids are clear.

Disc levels:

C2-C3: Mild to moderate facet hypertrophy on the left. Mild left C3
neural foraminal stenosis.

C3-C4: Mild to moderate facet hypertrophy bilaterally. Mild
foraminal endplate spurring on the right. No stenosis.

C4-C5:  Mild facet hypertrophy.  No stenosis.

C5-C6: Disc space loss. Circumferential disc osteophyte complex is
mild and slightly eccentric to the left. No spinal stenosis
suspected. Up to mild C6 foraminal stenosis greater on the left.

C6-C7: Mild disc bulging. Mild foraminal endplate spurring. No
convincing stenosis.

C7-T1:  Mild facet hypertrophy.  No stenosis.

Upper chest: Negative.
IMPRESSION: 1. No acute osseous abnormality in the cervical spine. Mild
dextroconvex scoliosis, straightening and mild reversal of upper
cervical lordosis.
2. Cervical spine degeneration without evidence of spinal stenosis.
Upper cervical facet hypertrophy and disc degeneration maximal at
C5-C6. Up to mild neural foraminal stenosis.

## 2022-04-23 LAB — HM MAMMOGRAPHY

## 2022-04-30 ENCOUNTER — Encounter: Payer: Self-pay | Admitting: Family Medicine

## 2022-05-01 ENCOUNTER — Ambulatory Visit (INDEPENDENT_AMBULATORY_CARE_PROVIDER_SITE_OTHER): Payer: BC Managed Care – PPO | Admitting: Family

## 2022-05-01 ENCOUNTER — Encounter: Payer: Self-pay | Admitting: Family

## 2022-05-01 VITALS — BP 102/63 | HR 72 | Temp 98.0°F | Ht <= 58 in | Wt 157.8 lb

## 2022-05-01 DIAGNOSIS — R1084 Generalized abdominal pain: Secondary | ICD-10-CM | POA: Diagnosis not present

## 2022-05-01 DIAGNOSIS — K5792 Diverticulitis of intestine, part unspecified, without perforation or abscess without bleeding: Secondary | ICD-10-CM

## 2022-05-01 MED ORDER — CEPHALEXIN 500 MG PO CAPS: 500.0000 mg | ORAL_CAPSULE | Freq: Four times a day (QID) | ORAL | 0 refills | Status: DC

## 2022-05-01 MED ORDER — METRONIDAZOLE 500 MG PO TABS: 500.0000 mg | ORAL_TABLET | Freq: Three times a day (TID) | ORAL | 0 refills | Status: AC

## 2022-05-01 MED ORDER — DICYCLOMINE HCL 20 MG PO TABS: 20.0000 mg | ORAL_TABLET | Freq: Three times a day (TID) | ORAL | 0 refills | Status: DC | PRN

## 2022-05-01 NOTE — Progress Notes (Unsigned)
Patient ID: [name], female    DOB: 04-13-1966, 56 y.o.   MRN: [id number]  Chief Complaint  Patient presents with   Abdominal Pain    Pt c/o abdominal spasms in lower left. Symptoms started on Sunday afternoon. Has not tried anything for the pain. Pt states the spasms were getting worse and she started having diarrhea.     HPI:     Pt reports her sx started Sunday, LLQ pain, has spasms and then has a loose/watery stools. Reports having similar sx in 06/2021 and CT scan indicated diverticulitis, treated with antibiotics and she recovered. Prior to that her first episode was in 2021 also treated w/antibiotics. Reports she is due to schedule a repeat colonoscopy.    Assessment & Plan:  1. Generalized abdominal pain sending Bentyl, advised on use & SE.  - dicyclomine (BENTYL) 20 MG tablet; Take 1 tablet (20 mg total) by mouth 3 (three) times daily as needed for spasms. And diarrhea  Dispense: 30 tablet; Refill: 0  2. Acute diverticulitis advised on clear liquids for 2 days, gradually start a low residue soft diet. Sending abt but advised to hold off for a couple of days if possible and see if sx improve on their own and taking the Bentyl.  - metroNIDAZOLE (FLAGYL) 500 MG tablet; Take 1 tablet (500 mg total) by mouth 3 (three) times daily for 7 days.  Dispense: 21 tablet; Refill: 0 - cephALEXin (KEFLEX) 500 MG capsule; Take 1 capsule (500 mg total) by mouth 4 (four) times daily.  Dispense: 28 capsule; Refill: 0   Subjective:    Outpatient Medications Prior to Visit  Medication Sig Dispense Refill   albuterol (VENTOLIN HFA) 108 (90 Base) MCG/ACT inhaler Inhale 2 puffs into the lungs every 4 (four) hours as needed.     Ascorbic Acid (VITAMIN C PO) Take by mouth.     B COMPLEX VITAMINS PO Take 1 tablet by mouth daily.     Calcium Carbonate (CALCIUM 500 PO) Take 1 tablet by mouth daily.     Cetirizine HCl (ZYRTEC PO) Take by mouth.     cholecalciferol (VITAMIN D) 25 MCG (1000  UT) tablet Take 1,000 Units by mouth daily.     COENZYME Q10-OMEGA 3 FATTY ACD PO Take by mouth.     estradiol (ESTRACE) 1 MG tablet Take 1 mg by mouth daily.     Ferrous Sulfate (IRON PO) Take by mouth. Has copper in this as well     Fluticasone Propionate (FLONASE NA) Place into the nose.     Lifitegrast (XIIDRA) 5 % SOLN Apply to eye.     Loratadine (CLARITIN PO) Take by mouth.     MAGNESIUM PO Take by mouth.     rosuvastatin (CRESTOR) 20 MG tablet Take 1 tablet (20 mg total) by mouth daily. 90 tablet 3   valACYclovir (VALTREX) 1000 MG tablet Take two tablets ( total 2000 mg) by mouth q12h x 1 day; Start: ASAP after symptom onset 30 tablet 0   Zinc Acetate, Oral, (ZINC ACETATE PO) Take 1 tablet by mouth daily.     No facility-administered medications prior to visit.   Past Medical History:  Diagnosis Date   Allergy    Coronary artery calcification    CT 07/2019: Ca score 3   Diverticulosis    hx of diverticulitis    Echocardiogram 12/2020   Echo 5/22: EF 60-65, no RWMA, GLS -21.0%, normal RVSF, mild MR, no pericardial effusion   GERD (  gastroesophageal reflux disease)    History of chickenpox    Hyperlipidemia    Wears eyeglasses    dry eyes and cornea change    Past Surgical History:  Procedure Laterality Date   ABDOMINAL HYSTERECTOMY     BLADDER SUSPENSION     CHOLECYSTECTOMY     LAPAROSCOPIC ENDOMETRIOSIS FULGURATION     3 separate surgeries   Allergies  Allergen Reactions   Ciprofloxacin Hives and Nausea And Vomiting   Codeine    Doxycycline Itching   Iodine    Penicillins     REACTION: hives   Sulfonamide Derivatives     REACTION: rash      Objective:    Physical Exam Vitals and nursing note reviewed.  Constitutional:      Appearance: Normal appearance.  Cardiovascular:     Rate and Rhythm: Normal rate and regular rhythm.  Pulmonary:     Effort: Pulmonary effort is normal.     Breath sounds: Normal breath sounds.  Musculoskeletal:        General:  Normal range of motion.  Skin:    General: Skin is warm and dry.  Neurological:     Mental Status: She is alert.  Psychiatric:        Mood and Affect: Mood normal.        Behavior: Behavior normal.    BP 102/63 (BP Location: Left Arm, Patient Position: Sitting, Cuff Size: Large)   Pulse 72   Temp 98 F (36.7 C) (Temporal)   Ht 4' (1.219 m)   Wt 157 lb 12.8 oz (71.6 kg)   SpO2 97%   BMI 48.15 kg/m  Wt Readings from Last 3 Encounters:  05/01/22 157 lb 12.8 oz (71.6 kg)  03/14/22 153 lb 6.1 oz (69.6 kg)  08/22/21 158 lb (71.7 kg)       Jeanie Sewer, NP

## 2022-05-02 NOTE — Patient Instructions (Signed)
It was very nice to see you today!    Try a liquid diet for a couple of days, then gradually increase to soft foods, low residue (no nuts, seeds, stringy foods, etc). Take the Bentyl as needed for stomach cramping and diarrhea. Symptoms can improve and resolve without the antibiotics, but they have been sent if above steps are not successful.  Call back if symptoms are not improving.       PLEASE NOTE:  If you had any lab tests please let us know if you have not heard back within a few days. You may see your results on MyChart before we have a chance to review them but we will give you a call once they are reviewed by [place]. If we ordered any referrals today, please let us know if you have not heard from their office within the next week.

## 2022-05-07 ENCOUNTER — Encounter: Payer: Self-pay | Admitting: *Deleted

## 2022-05-17 ENCOUNTER — Ambulatory Visit (INDEPENDENT_AMBULATORY_CARE_PROVIDER_SITE_OTHER): Payer: BC Managed Care – PPO | Admitting: Family Medicine

## 2022-05-17 ENCOUNTER — Encounter: Payer: Self-pay | Admitting: Family Medicine

## 2022-05-17 VITALS — BP 96/60 | HR 79 | Temp 97.8°F | Ht 69.0 in | Wt 161.6 lb

## 2022-05-17 DIAGNOSIS — E785 Hyperlipidemia, unspecified: Secondary | ICD-10-CM | POA: Diagnosis not present

## 2022-05-17 DIAGNOSIS — Z789 Other specified health status: Secondary | ICD-10-CM | POA: Diagnosis not present

## 2022-05-17 DIAGNOSIS — Z8719 Personal history of other diseases of the digestive system: Secondary | ICD-10-CM | POA: Diagnosis not present

## 2022-05-17 DIAGNOSIS — Z90710 Acquired absence of both cervix and uterus: Secondary | ICD-10-CM

## 2022-05-17 DIAGNOSIS — Z0000 Encounter for general adult medical examination without abnormal findings: Secondary | ICD-10-CM

## 2022-05-17 DIAGNOSIS — Z1211 Encounter for screening for malignant neoplasm of colon: Secondary | ICD-10-CM | POA: Diagnosis not present

## 2022-05-17 LAB — COMPREHENSIVE METABOLIC PANEL
ALT: 28 U/L (ref 0–35)
AST: 24 U/L (ref 0–37)
Albumin: 4.3 g/dL (ref 3.5–5.2)
Alkaline Phosphatase: 57 U/L (ref 39–117)
BUN: 22 mg/dL (ref 6–23)
CO2: 29 mEq/L (ref 19–32)
Calcium: 9.3 mg/dL (ref 8.4–10.5)
Chloride: 104 mEq/L (ref 96–112)
Creatinine, Ser: 0.91 mg/dL (ref 0.40–1.20)
GFR: 71.51 mL/min (ref >60.00)
Glucose, Bld: 99 mg/dL (ref 70–99)
Potassium: 4.9 mEq/L (ref 3.5–5.1)
Sodium: 138 mEq/L (ref 135–145)
Total Bilirubin: 1.1 mg/dL (ref 0.2–1.2)
Total Protein: 7.3 g/dL (ref 6.0–8.3)

## 2022-05-17 LAB — LDL CHOLESTEROL, DIRECT: Direct LDL: 119 mg/dL

## 2022-05-17 NOTE — Progress Notes (Signed)
Phone [phone]   Subjective:  Patient presents today for their annual physical. Chief complaint-noted.   See problem oriented charting- ROS- full  review of systems was completed and negative except for: sinus pressure, tinnitus, fatigue, dizziness, dry eyes, seasonal allergies  The following were reviewed and entered/updated in epic: Past Medical History:  Diagnosis Date   Allergy    Coronary artery calcification    CT 07/2019: Ca score 3   Diverticulosis    hx of diverticulitis    Echocardiogram 12/2020   Echo 5/22: EF 60-65, no RWMA, GLS -21.0%, normal RVSF, mild MR, no pericardial effusion   GERD (gastroesophageal reflux disease)    History of chickenpox    Hyperlipidemia    Wears eyeglasses    dry eyes and cornea change    Patient Active Problem List   Diagnosis Date Noted   Bilateral occipital neuralgia 01/18/2021    Priority: High   Dry eye syndrome of both eyes 01/18/2021    Priority: High   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 01/18/2021    Priority: High   Postviral syndrome 10/13/2020    Priority: High   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 10/13/2020    Priority: High   S/P complete hysterectomy 05/17/2022    Priority: Medium    Tinnitus aurium, bilateral 01/18/2021    Priority: Medium    Chronic tension-type headache, intractable 10/13/2020    Priority: Medium    Occipital headache 10/13/2020    Priority: Medium    Fatty liver 09/19/2020    Priority: Medium    Pure hypercholesterolemia 07/07/2019    Priority: Medium    Postsurgical menopause 08/13/2009    Priority: Medium    Allergic rhinitis 11/03/2008    Priority: Medium    Photophobia of both eyes 01/18/2021    Priority: Low   Orthostatic hypertension 10/13/2020    Priority: Low   Dysphonia 10/13/2020    Priority: Low   Diverticular disease of colon 09/19/2020    Priority: Low   Cavernous hemangioma of liver 08/21/2019    Priority: Low   Thyroid  nodule 08/21/2019    Priority: Low   Herpes simplex 07/07/2019    Priority: Low   GERD (gastroesophageal reflux disease) 07/07/2019    Priority: Low   Overactive bladder 07/07/2019    Priority: Low   Situational depression 11/03/2008    Priority: Low   Right cervical radiculopathy 08/22/2021   Cervico-occipital neuralgia of the right side 08/22/2021   Past Surgical History:  Procedure Laterality Date   ABDOMINAL HYSTERECTOMY     BLADDER SUSPENSION     CHOLECYSTECTOMY     LAPAROSCOPIC ENDOMETRIOSIS FULGURATION     3 separate surgeries    Family History  Problem Relation Age of Onset   Hyperlipidemia Mother    Hypertension Mother    Glaucoma Mother    Memory loss Mother        being evaluated for alzheimers   Diabetes Father    Hypertension Father    Cancer Father        Melanoma   Dementia Father    Lymphoma Father        MALT- was told not hereditary   Colon polyps Sister    Cancer Sister        Skin   Parkinson's disease Maternal Grandfather    Other Son        PANDAS    Medications- reviewed and updated Current Outpatient Medications  Medication Sig Dispense Refill  albuterol (VENTOLIN HFA) 108 (90 Base) MCG/ACT inhaler Inhale 2 puffs into the lungs every 4 (four) hours as needed.     Ascorbic Acid (VITAMIN C PO) Take by mouth.     B COMPLEX VITAMINS PO Take 1 tablet by mouth daily.     Calcium Carbonate (CALCIUM 500 PO) Take 1 tablet by mouth daily.     Cetirizine HCl (ZYRTEC PO) Take by mouth.     cholecalciferol (VITAMIN D) 25 MCG (1000 UT) tablet Take 1,000 Units by mouth daily.     COENZYME Q10-OMEGA 3 FATTY ACD PO Take by mouth.     dicyclomine (BENTYL) 20 MG tablet Take 1 tablet (20 mg total) by mouth 3 (three) times daily as needed for spasms. And diarrhea 30 tablet 0   estradiol (ESTRACE) 1 MG tablet Take 1 mg by mouth daily.     Ferrous Sulfate (IRON PO) Take by mouth. Has copper in this as well     Fluticasone Propionate (FLONASE NA) Place into  the nose.     Lifitegrast (XIIDRA) 5 % SOLN Apply to eye.     Loratadine (CLARITIN PO) Take by mouth.     MAGNESIUM PO Take by mouth.     rosuvastatin (CRESTOR) 20 MG tablet Take 1 tablet (20 mg total) by mouth daily. 90 tablet 3   valACYclovir (VALTREX) 1000 MG tablet Take two tablets ( total 2000 mg) by mouth q12h x 1 day; Start: ASAP after symptom onset 30 tablet 0   Zinc Acetate, Oral, (ZINC ACETATE PO) Take 1 tablet by mouth daily.     No current facility-administered medications for this visit.    Allergies-reviewed and updated Allergies  Allergen Reactions   Ciprofloxacin Hives and Nausea And Vomiting   Codeine    Doxycycline Itching   Iodine    Penicillins     REACTION: hives   Sulfonamide Derivatives     REACTION: rash    Social History   Social History Narrative   Lives at home with spouse. [name] 21 and [name] 17  at home in 2022.       Disabled 2022 from [place] covid- had been doing temp prior to long covid      Hobbies: enjoys watching thomas play basketball, beach, time with friends, shopping      Right handed   Caffeine: no   Objective  Objective:  BP 96/60   Pulse 79   Temp 97.8 F (36.6 C)   Ht '5\' 9"'$  (1.753 m)   Wt 161 lb 9.6 oz (73.3 kg)   SpO2 96%   BMI 23.86 kg/m  Gen: NAD, resting comfortably HEENT: Mucous membranes are moist. Oropharynx normal Neck: no thyromegaly CV: RRR no murmurs rubs or gallops Lungs: CTAB no crackles, wheeze, rhonchi Abdomen: soft/nontender/nondistended/normal bowel sounds. No rebound or guarding.  Ext: no edema Skin: warm, dry Neuro: grossly normal, moves all extremities, PERRLA   Assessment and Plan   56 y.o. female presenting for annual physical.  Health Maintenance counseling: 1. Anticipatory guidance: Patient counseled regarding regular dental exams - advised q6 months, eye exams-yearly at least-deals with dry eyes- was q3 months- now 2-3x a year,  avoiding smoking and second hand smoke , limiting alcohol to 1  beverage per day- not even 1 a week , no illicit drugs.   2. Risk factor reduction:  Advised patient of need for regular exercise and diet rich and fruits and vegetables to reduce risk of heart attack and stroke.  Exercise-last year was  struggling with this due to long COVID, still struggling with the same- long term goal 150 minutes a week exercise.  Diet/weight management-up 9 pounds from last year-was doing optivia at that time -  still with healthy weight- eating reasonably healthy diet.  Wt Readings from Last 3 Encounters:  05/17/22 161 lb 9.6 oz (73.3 kg)  05/01/22 157 lb 12.8 oz (71.6 kg)  03/14/22 153 lb 6.1 oz (69.6 kg)  3. Immunizations/screenings/ancillary studies-opts out of COVID and flu shot.. Also recommended tdap- plans to think on this.  Immunization History  Administered Date(s) Administered   Influenza Split 07/19/2007, 08/01/2009, 07/25/2011, 06/05/2012   Influenza,inj,quad, With Preservative 08/09/2014, 08/10/2015   Influenza-Unspecified 05/13/2014, 05/16/2015   PFIZER(Purple Top)SARS-COV-2 Vaccination 11/05/2019, 11/26/2019  4. Cervical cancer screening- s/p total hysterectomy for benign reasons 5. Breast cancer screening-  breast exam with GYN and mammogram 04/23/22- finally got a normal report  6. Colon cancer screening - 10/12/14 with Dr. Collene Mares- was told 5 years now due once again- strongly encouraged to call Dr. Collene Mares again to consider this even if they schedule several months out due to diverticulitis  7. Skin cancer screening- saw Dr. [place] gso derm x2 in last year. advised regular sunscreen use. Denies worrisome, changing, or new skin lesions.  8. Birth control/STD check- postmenopausal and monogamous/plus hysterectomy  9. Osteoporosis screening at 21- normal bone density 2022 -Never smoker  Status of chronic or acute concerns   #Social update-celiac plexus block planned for Coralie Common - she is somewhat anxious about this.   #Long COVID-history of suffering with this  and from note  "-had vertigo, brain fog that was severe - starting to do better with memory, palpitations (saw cardiology and had good report- mild mitral valve regurgitation) - dry eyes related to long covid per patient as well. Persists.  -also has seen a tinnitus clinic at [facility]  (out of pocket)after seeing ENT. Persists- does not want to use devices -has also seen chiropractor - also had occipital neuralgia- working with Dr. Brett Fairy- that is still bothering her- cold water in AM does help her. " -Released from neurology after 08/22/2021 visit- tried nerve injections without significant relief and uncomfortable-still gets neck tension -Today she reports some improvement but still very bothersome- vertigo, brain fog/fogetfulness, tinnitus. Palpitations worsened with crestor restart- but still occasional   #hyperlipidemia-CT calcium scoring of 3 on 07/17/2019 S: Medication:restarted rosuvastatin 20 mg. Previously on statin when had CT cardiac scoring and cardiology thought score could be higher due to the statin itself-currently not on medication The 10-year ASCVD risk score (Arnett DK, et al., 2019) is: 1.7%* (Cholesterol units were assumed) Lab Results  Component Value Date   CHOL 297 (A) 04/02/2022   HDL 52 04/02/2022   LDLCALC 179 04/02/2022   LDLDIRECT 237.0 02/24/2021   TRIG 340 (A) 04/02/2022   CHOLHDL 5 02/24/2021   A/P: After recent labs we did restart rosuvastatin 20 mg daily-we will update direct LDL and CMP today- hoping for at least 50% reduction  #Thyroid nodule in 2015-did not meet criteria for biopsy.  TSH has been normal-recently checked Lab Results  Component Value Date   TSH 1.17 04/02/2022   #Recently had A1c which was reassuring at 5.5-labs were done through GYN - statin could increase risk   #Hormone replacement-on this through GYN-aware of potential risks- may run by Dr. 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  Lab/Order associations: fasting   ICD-10-CM   1. Preventative health care  Z00.00     2. Encounter for screening colonoscopy  Z12.11     3. History of diverticulitis  Z87.19     4. Unknown status of immunity to COVID-19 virus  Z78.9     5. Hyperlipidemia, unspecified hyperlipidemia type  E78.5     6. S/P complete hysterectomy  Z90.710       No orders of the defined types were placed in this encounter.   Return precautions advised.  Garret Reddish, MD

## 2022-05-17 NOTE — Patient Instructions (Addendum)
Sign release of information at the check out desk for PAP SMEAR.  long term goal 150 minutes a week exercise- but even building up 1-2 days a week 10-15 minutes is a starting point  Tdap is due it appears- if get cut or scrape or bad sting- need to update this ASAP within 24 hours ideally  Please stop by lab before you go If you have mychart- we will send your results within 3 business days of [place] receiving them.  If you do not have mychart- we will call you about results within 5 business days of [place] receiving them.  *please also note that you will see labs on mychart as soon as they post. I will later go in and write notes on them- will say "notes from Dr. Yong Channel"   Lets schedule follow up with Dr. Collene Mares (we placed referral) , Dr. Johnsie Cancel, Dentist  Recommended follow up: Return in about 1 year (around 05/18/2023) for physical or sooner if needed.Schedule b4 you leave.

## 2022-05-23 ENCOUNTER — Encounter: Payer: Self-pay | Admitting: Family Medicine

## 2022-06-22 NOTE — Progress Notes (Unsigned)
Cardiology Office Note:    Date:  06/25/2022   ID:  [name], DOB 1966-02-20, MRN [id number]  PCP:  Heather Olp, MD   [place] Prov[facility] Cardiologist:  Heather Rouge, MD      Referring MD: Heather Olp, MD   Chief Complaint:  No chief complaint on file.    Patient Profile:    [name] is a 56 y.o. female with:  Coronary calcification (CT 07/2019) Hyperlipidemia GERD Diverticulosis  Diverticulitis in 09/2018     Prior CV studies: NON-TELEMETRY MONITORING-INTERPRETATION ONLY 12/09/2020 Narrative NSR Sinus arrhythmia benign Artifact No significant arrhythmias with any symptoms including fluttering, dizziness and palpitations  Echocardiogram 12/19/20 1. Left ventricular ejection fraction, by estimation, is 60 to 65%. The left ventricle has normal function. The left ventricle has no regional wall motion abnormalities. Left ventricular diastolic parameters were normal. The average left ventricular  global longitudinal strain is -21.0 %. The global longitudinal strain is normal. 2. Right ventricular systolic function is normal. The right ventricular size is normal. 3. The mitral valve is normal in structure. Mild mitral valve regurgitation. No evidence of mitral stenosis. 4. The aortic valve is normal in structure. Aortic valve regurgitation is not visualized. No aortic stenosis is present. 5. The inferior vena cava is normal in size with greater than 50% respiratory variability, suggesting right atrial pressure of 3 mmHg.  CT CARDIAC SCORING 07/17/2019 Addendum 07/17/2019  1:18 PM Ascending aorta: Normal diameter Pericardium: Normal Coronary arteries: One small area of calcium noted in mid LAD and proximal RCA IMPRESSION: Coronary calcium score of 3. This was 52 th percentile for age and sex matched control.   History of Present Illness:  56 y.o. with history of GERD, HLD and coronary calcium see above. Seen by PA 12/2021 and was  suffering from some long haul COVID symptoms that seemed to improved over time and with antihistamines. Dyspnea better TTE/monitor done and benign She has a son [name] and [name]  She has been friends with my wife over the years  Son [name] has some sort of rare celiac dx and is having surgery at [facility] He has had multiple infections and is at [employer] also a State grade doing Scientist, water quality  She is back on crestor LDL improved discussed f/u calcium score ECG today normal and no symptoms  .        Past Medical History:  Diagnosis Date   Allergy    Coronary artery calcification    CT 07/2019: Ca score 3   Diverticulosis    hx of diverticulitis    Echocardiogram 12/2020   Echo 5/22: EF 60-65, no RWMA, GLS -21.0%, normal RVSF, mild MR, no pericardial effusion   GERD (gastroesophageal reflux disease)    History of chickenpox    Hyperlipidemia    Wears eyeglasses    dry eyes and cornea change     Current Medications: Current Meds  Medication Sig   albuterol (VENTOLIN HFA) 108 (90 Base) MCG/ACT inhaler Inhale 2 puffs into the lungs every 4 (four) hours as needed.   dicyclomine (BENTYL) 20 MG tablet Take 1 tablet (20 mg total) by mouth 3 (three) times daily as needed for spasms. And diarrhea   estradiol (ESTRACE) 1 MG tablet Take 1 mg by mouth daily.   rosuvastatin (CRESTOR) 20 MG tablet Take 1 tablet (20 mg total) by mouth daily.   valACYclovir (VALTREX) 1000 MG tablet Take two tablets ( total 2000 mg) by mouth q12h x  1 day; Start: ASAP after symptom onset     Allergies:   Ciprofloxacin, Codeine, Doxycycline, Iodine, Penicillins, and Sulfonamide derivatives   Social History   Tobacco Use   Smoking status: Never   Smokeless tobacco: Never  Vaping Use   Vaping Use: Never used  Substance Use Topics   Alcohol use: Yes    Comment: social- less than 1 a week   Drug use: Never     Family Hx: The patient's family history includes Cancer in her father and  sister; Colon polyps in her sister; Dementia in her father; Diabetes in her father; Glaucoma in her mother; Hyperlipidemia in her mother; Hypertension in her father and mother; Lymphoma in her father; Memory loss in her mother; Other in her son; Parkinson's disease in her maternal grandfather.  ROS   EKGs/Labs/Other Test Reviewed:    EKG:  11/01/20 SR rate 79 normal 06/25/2022 SR rate 76 normal   Recent Labs: 04/02/2022: Hemoglobin 14.6; Platelets 302; TSH 1.17 05/17/2022: ALT 28; BUN 22; Creatinine, Ser 0.91; Potassium 4.9; Sodium 138   Recent Lipid Panel Lab Results  Component Value Date/Time   CHOL 297 (A) 04/02/2022 12:00 AM   TRIG 340 (A) 04/02/2022 12:00 AM   HDL 52 04/02/2022 12:00 AM   LDLCALC 179 04/02/2022 12:00 AM   LDLCALC 85 07/07/2019 03:01 PM   LDLDIRECT 119.0 05/17/2022 10:14 AM      Risk Assessment/Calculations:      Physical Exam:    VS:  BP 118/68   Pulse 76   Ht '5\' 9"'$  (1.753 m)   Wt 169 lb (76.7 kg)   SpO2 98%   BMI 24.96 kg/m     Wt Readings from Last 3 Encounters:  06/25/22 169 lb (76.7 kg)  05/17/22 161 lb 9.6 oz (73.3 kg)  05/01/22 157 lb 12.8 oz (71.6 kg)     Affect appropriate Healthy:  appears stated age HEENT: normal Neck supple with no adenopathy JVP normal no bruits no thyromegaly Lungs clear with no wheezing and good diaphragmatic motion Heart:  S1/S2 no murmur, no rub, gallop or click PMI normal Abdomen: benighn, BS positve, no tenderness, no AAA no bruit.  No HSM or HJR Distal pulses intact with no bruits No edema Neuro non-focal Skin warm and dry No muscular weakness        ASSESSMENT & PLAN:    1. Palpitations:  benign monitor normal ECG observe   2. Long COVID- improved f/u primary   3. Coronary artery calcification seen on CT scan Now off statin after weight loss and diet change Calcium score 3 in 2020 will update LDL improved from a year ago 237-> 119  Crestor 20 mg daily Will advise further after updated  calcium score     Dispo:  No follow-ups on file.   Medication Adjustments/Labs and Tests Ordered: Current medicines are reviewed at length with the patient today.  Concerns regarding medicines are outlined above.  Tests Ordered: No orders of the defined types were placed in this encounter.  Calcium Score  Medication Changes: No orders of the defined types were placed in this encounter.  F/U in a year  Signed, Heather Rouge, MD  06/25/2022 8:34 AM    [address], [place], [place]  [address] Phone: [phone]; Fax: [phone]

## 2022-06-25 ENCOUNTER — Encounter: Payer: Self-pay | Admitting: Cardiovascular Disease

## 2022-06-25 ENCOUNTER — Ambulatory Visit: Payer: BC Managed Care – PPO | Attending: Cardiovascular Disease | Admitting: Cardiovascular Disease

## 2022-06-25 VITALS — BP 118/68 | HR 76 | Ht 69.0 in | Wt 169.0 lb

## 2022-06-25 DIAGNOSIS — E782 Mixed hyperlipidemia: Secondary | ICD-10-CM | POA: Diagnosis not present

## 2022-06-25 DIAGNOSIS — R002 Palpitations: Secondary | ICD-10-CM | POA: Diagnosis not present

## 2022-06-25 DIAGNOSIS — U099 Post covid-19 condition, unspecified: Secondary | ICD-10-CM

## 2022-06-25 DIAGNOSIS — I2510 Atherosclerotic heart disease of native coronary artery without angina pectoris: Secondary | ICD-10-CM

## 2022-06-25 NOTE — Patient Instructions (Addendum)
Medication Instructions:  Your physician recommends that you continue on your current medications as directed. Please refer to the Current Medication list given to you today.  *If you need a refill on your cardiac medications before your next appointment, please call your pharmacy*  Lab Work: If you have labs (blood work) drawn today and your tests are completely normal, you will receive your results only by: [place] (if you have MyChart) OR A paper copy in the mail If you have any lab test that is abnormal or we need to change your treatment, we will call you to review the results.  Testing/Procedures: Cardiac CT scanning for calcium score, (CAT scanning), is a noninvasive, special x-ray that produces cross-sectional images of the body using x-rays and a computer. CT scans help physicians diagnose and treat medical conditions. For some CT exams, a contrast material is used to enhance visibility in the area of the body being studied. CT scans provide greater clarity and reveal more details than regular x-ray exams.  Follow-Up: At [facility], you and your health needs are our priority.  As part of our continuing mission to provide you with exceptional heart care, we have created designated Provider Care Teams.  These Care Teams include your primary Cardiologist (physician) and Advanced Practice Providers (APPs -  Physician Assistants and Nurse Practitioners) who all work together to provide you with the care you need, when you need it.  We recommend signing up for the patient portal called "MyChart".  Sign up information is provided on this After Visit Summary.  MyChart is used to connect with patients for Virtual Visits (Telemedicine).  Patients are able to view lab/test results, encounter notes, upcoming appointments, etc.  Non-urgent messages can be sent to your provider as well.   To learn more about what you can do with MyChart, go to [email or web address].    Your next  appointment:   12 month(s)  The format for your next appointment:   In Person  Provider:   Jenkins Rouge, MD       Important Information About Sugar

## 2022-06-26 ENCOUNTER — Ambulatory Visit (HOSPITAL_COMMUNITY)
Admission: RE | Admit: 2022-06-26 | Discharge: 2022-06-26 | Disposition: A | Discharge status: Home / Self Care | Payer: BC Managed Care – PPO | Source: Ambulatory Visit | Attending: Cardiovascular Disease | Admitting: Cardiovascular Disease

## 2022-06-26 ENCOUNTER — Encounter (HOSPITAL_COMMUNITY): Payer: Self-pay

## 2022-06-26 DIAGNOSIS — U099 Post covid-19 condition, unspecified: Secondary | ICD-10-CM | POA: Insufficient documentation

## 2022-06-26 DIAGNOSIS — R002 Palpitations: Secondary | ICD-10-CM | POA: Insufficient documentation

## 2022-06-26 DIAGNOSIS — I2510 Atherosclerotic heart disease of native coronary artery without angina pectoris: Secondary | ICD-10-CM | POA: Insufficient documentation

## 2022-07-02 ENCOUNTER — Encounter: Payer: Self-pay | Admitting: Family Medicine

## 2022-07-02 NOTE — Telephone Encounter (Signed)
FYI

## 2022-07-10 ENCOUNTER — Encounter: Payer: Self-pay | Admitting: Family Medicine

## 2022-07-16 ENCOUNTER — Ambulatory Visit (INDEPENDENT_AMBULATORY_CARE_PROVIDER_SITE_OTHER): Payer: BC Managed Care – PPO | Admitting: Family Medicine

## 2022-07-16 ENCOUNTER — Encounter: Payer: Self-pay | Admitting: Family Medicine

## 2022-07-16 VITALS — BP 133/80 | HR 79 | Temp 98.2°F | Ht 69.0 in | Wt 172.0 lb

## 2022-07-16 DIAGNOSIS — M545 Low back pain, unspecified: Secondary | ICD-10-CM

## 2022-07-16 NOTE — Patient Instructions (Addendum)
Tdap when you feel better- can call back for nurse visit  Thrilled you are doing better- complete course of prednisone  Let us know if you need any refills on robaxin (doubt you will)  Continue with home stretches- if you would like to trial formal physical therapy let us know.   I do not think this sounds like stones so hold off on ct of abdomen and pelvis for now  Consider myfitnesspal  Recommended follow up: Return for as needed for new, worsening, persistent symptoms. The other option if worsening would be to return to orthopedics to consider next steps.

## 2022-07-16 NOTE — Progress Notes (Signed)
Phone [phone] In person visit   Subjective:   [name] is a 56 y.o. year old very pleasant female patient who presents for/with See problem oriented charting Chief Complaint  Patient presents with   Follow-up    Pt states she isnt having spasms but is still having pain.    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 10/13/2020    Priority: High   S/P complete hysterectomy 05/17/2022    Priority: Medium    Tinnitus aurium, bilateral 01/18/2021    Priority: Medium    Chronic tension-type headache, intractable 10/13/2020    Priority: Medium    Occipital headache 10/13/2020    Priority: Medium    Fatty liver 09/19/2020    Priority: Medium    Pure hypercholesterolemia 07/07/2019    Priority: Medium    Postsurgical menopause 08/13/2009    Priority: Medium    Allergic rhinitis 11/03/2008    Priority: Medium    Photophobia of both eyes 01/18/2021    Priority: Low   Orthostatic hypertension 10/13/2020    Priority: Low   Dysphonia 10/13/2020    Priority: Low   Diverticular disease of colon 09/19/2020    Priority: Low   Cavernous hemangioma of liver 08/21/2019    Priority: Low   Thyroid nodule 08/21/2019    Priority: Low   Herpes simplex 07/07/2019    Priority: Low   GERD (gastroesophageal reflux disease) 07/07/2019    Priority: Low   Overactive bladder 07/07/2019    Priority: Low   Situational depression 11/03/2008    Priority: Low   Right cervical radiculopathy 08/22/2021   Cervico-occipital neuralgia of the right side 08/22/2021    Medications- reviewed and updated Current Outpatient  Medications  Medication Sig Dispense Refill   albuterol (VENTOLIN HFA) 108 (90 Base) MCG/ACT inhaler Inhale 2 puffs into the lungs every 4 (four) hours as needed.     dicyclomine (BENTYL) 20 MG tablet Take 1 tablet (20 mg total) by mouth 3 (three) times daily as needed for spasms. And diarrhea 30 tablet 0   estradiol (ESTRACE) 1 MG tablet Take 1 mg by mouth daily.     rosuvastatin (CRESTOR) 20 MG tablet Take 1 tablet (20 mg total) by mouth daily. 90 tablet 3   valACYclovir (VALTREX) 1000 MG tablet Take two tablets ( total 2000 mg) by mouth q12h x 1 day; Start: ASAP after symptom onset 30 tablet 0   No current facility-administered medications for this visit.     Objective:  BP 133/80 (BP Location: Right Arm, Patient Position: Sitting)   Pulse 79   Temp 98.2 F (36.8 C) (Temporal)   Ht '5\' 9"'$  (1.753 m)   Wt 172 lb (78 kg)   SpO2 96%   BMI 25.40 kg/m  Gen: NAD, resting comfortably CV: RRR no murmurs rubs or gallops Lungs: CTAB no crackles, wheeze, rhonchi Abdomen: soft/nontender/nondistended/normal bowel sounds. No rebound or guarding.  Ext: no edema Skin: warm, dry Back - Normal skin, Spine with normal alignment and no deformity.  No tenderness to vertebral process palpation.  Paraspinous muscles are tender and with spasm in right low  back- tender in band like distribution around to right side (without rash).   Negative Straight leg raise - but does get some back pain beyond 45 degrees on right Neuro- no saddle anesthesia, 5/5 strength lower extremities     Assessment and Plan   # Back pain/spasm S: Patient reached out to [place] through [place] on 07/10/2022-at that point she was on day 2 of lower back pain and spasms (occurred whether sitting, standing, moving).  Had minimal relief with Advil and heating pad.  Radiated across her lower back and into her right hip-painful enough to stop her from moving at times.  Intensity of pain was similar to when she had endometriosis before having  children but was status post hysterectomy.  She was seen by [facility] later that day at their walk-in clinic-x-rays without any significant underlying findings to cause her symptoms.  They asked if she had kidney problems or stones before.  They gave an injection of Toradol and a prescription for prednisone and Robaxin. Plan was as needed follow up and consider MRI  She also saw her gynecologist Dr. Jeanine Luz on November 30-who did not think symptoms were GYN related.  She finally started the prednisone on the 30th-seem to start having some mild relief from this in combination with Robaxin  Today she reports-spasms have left but still having pain in right low back radiating to right low hip. Able to move around house some finally. Worse with sitting- walking/standing up helps some. Previously was having to hold onto things to not fall due to pain but thankfully that has improved. Pain up to 5/10 now but previously was 10/10.  Has 2 doses left of prednisone (dose pack). Still taking robaxin.  -has restarted some walking in general since October but no core exercises. Has a friend who is an Product/process development scientist mentioned stretching.  -no injury before this- was sitting on couch most of weekend- all came on quickly.  -did note some abdominal firmness- started a probiotic and helped some.  -no dysuria or polyuria Prior pain was across lower back but now mainly in right low back and radiating to right lateral hip and mid axillary line. Less stabbing sensation. Previously included thoracic spine but now only in lumbar -no incontinence or incontinence or saddle anesthesia -was given some stretches from murphy/wainer  A/P: significant improvement with prednisone and robaxin- I am very encouraged as pain level down about 50% and has not completed prednisone course -offered formal PT but has exercises at home and wants to start with those- that's reasonable and if not continuing to progress or symptoms worsen we can  refer to PT in the future (she can just call or mychart [place])   #Weight gain- in August was at 150 and she is up to 172. Had stopped opt[employer] program. We checked thyroid in August.  -plans in long run to get back to [facility] -cooking from home already and working on portion sizes -similar weight when started opt[employer]- her baseline is around 150-155 - could trial myfitnesspal Lab Results  Component Value Date   TSH 1.17 04/02/2022   Recommended follow up: Return for as needed for new, worsening, persistent symptoms. Future Appointments  Date Time Provider [place]  05/21/2023  9:00 AM Marin Olp, MD [facility]   Lab/Order associations:   ICD-10-CM   1. Acute bilateral low back pain without sciatica  M54.50      Time Spent: 32 minutes of total time (11:45 PM- 12:17 PM) was spent  on the date of the encounter performing the following actions: chart review prior to seeing the patient-including summarizing prior mychart notes, obtaining history directly from patient, performing a medically necessary exam, counseling on the treatment plan and options, and documenting in our EHR.   Return precautions advised.  Garret Reddish, MD

## 2022-07-18 ENCOUNTER — Ambulatory Visit: Payer: BC Managed Care – PPO | Admitting: Family Medicine

## 2022-08-27 ENCOUNTER — Encounter: Payer: Self-pay | Admitting: Family Medicine

## 2022-08-27 NOTE — Telephone Encounter (Signed)
Patient has been scheduled for next available visit on 09/07/22 @ 3:20pm. Patient requests any other recommendations from PCP prior to visit.

## 2022-08-28 ENCOUNTER — Encounter: Payer: Self-pay | Admitting: Family Medicine

## 2022-08-28 ENCOUNTER — Ambulatory Visit (INDEPENDENT_AMBULATORY_CARE_PROVIDER_SITE_OTHER): Payer: BC Managed Care – PPO | Admitting: Family Medicine

## 2022-08-28 VITALS — BP 122/78 | HR 79 | Temp 98.2°F | Ht 69.0 in | Wt 174.4 lb

## 2022-08-28 DIAGNOSIS — L509 Urticaria, unspecified: Secondary | ICD-10-CM

## 2022-08-28 DIAGNOSIS — E785 Hyperlipidemia, unspecified: Secondary | ICD-10-CM | POA: Diagnosis not present

## 2022-08-28 NOTE — Progress Notes (Signed)
Phone [phone] In person visit   Subjective:   [name] is a 57 y.o. year old very pleasant female patient who presents for/with See problem oriented charting Chief Complaint  Patient presents with   Urticaria    Pt c/o hives on different areas of her body (see mychart).   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 10/13/2020    Priority: High   S/P complete hysterectomy 05/17/2022    Priority: Medium    Tinnitus aurium, bilateral 01/18/2021    Priority: Medium    Chronic tension-type headache, intractable 10/13/2020    Priority: Medium    Occipital headache 10/13/2020    Priority: Medium    Fatty liver 09/19/2020    Priority: Medium    Pure hypercholesterolemia 07/07/2019    Priority: Medium    Postsurgical menopause 08/13/2009    Priority: Medium    Allergic rhinitis 11/03/2008    Priority: Medium    Photophobia of both eyes 01/18/2021    Priority: Low   Orthostatic hypertension 10/13/2020    Priority: Low   Dysphonia 10/13/2020    Priority: Low   Diverticular disease of colon 09/19/2020    Priority: Low   Cavernous hemangioma of liver 08/21/2019    Priority: Low   Thyroid nodule 08/21/2019    Priority: Low   Herpes simplex 07/07/2019    Priority: Low   GERD (gastroesophageal reflux disease) 07/07/2019    Priority: Low   Overactive bladder 07/07/2019    Priority: Low   Situational depression 11/03/2008    Priority: Low   Right cervical radiculopathy 08/22/2021   Cervico-occipital neuralgia of the right side 08/22/2021    Medications- reviewed and updated Current Outpatient Medications   Medication Sig Dispense Refill   albuterol (VENTOLIN HFA) 108 (90 Base) MCG/ACT inhaler Inhale 2 puffs into the lungs every 4 (four) hours as needed.     dicyclomine (BENTYL) 20 MG tablet Take 1 tablet (20 mg total) by mouth 3 (three) times daily as needed for spasms. And diarrhea 30 tablet 0   estradiol (ESTRACE) 1 MG tablet Take 1 mg by mouth daily.     rosuvastatin (CRESTOR) 20 MG tablet Take 1 tablet (20 mg total) by mouth daily. 90 tablet 3   valACYclovir (VALTREX) 1000 MG tablet Take two tablets ( total 2000 mg) by mouth q12h x 1 day; Start: ASAP after symptom onset 30 tablet 0   No current facility-administered medications for this visit.     Objective:  BP 122/78   Pulse 79   Temp 98.2 F (36.8 C)   Ht '5\' 9"'$  (1.753 m)   Wt 174 lb 6.4 oz (79.1 kg)   SpO2 98%   BMI 25.75 kg/m  Gen: NAD, resting comfortably CV: RRR no murmurs rubs or gallops Lungs: CTAB no crackles, wheeze, rhonchi Abdomen: soft/nontender/nondistended/normal bowel sounds.  Ext: no edema Skin: warm, dry, no active hives today or clear skin rash     Assessment and Plan   #Hives S:hives for the last month reported. Becoming more consistent. Started when at [facility] clinic with her son around dec 13 or 14th- thought a local  reaction (heating unit did come on every 10 minutes for 2 minutes then go off then smelled sewage smell-wondered if something came in from heating unit) but has continued despite coming back home- typically back, arm, inner thighs, abdomen. Intensely pruritic- has takxen xyzal at night and claritin in the day with benadryl cream. Tried valtrex without relief (had for prior cold sores). Had taken robaxin week or two prior but not on the trip- so otherwie no new medicine.   Felt a swollen spot behind her ear and touched it and caused pain to base of skull- wondered if related to other long covid issues. Swelling has gone down but still irritation there- deep breaths tend to help. Did get some  tinnitus.  -better today and yesterday. Feels some itch in are of skin that has improved.   None on face or chest. Has had on arms and upper inner thigh. Nothing on palms of hands or soles of feet. Mild cough for a couple of months- worse when laying down. Up another 2 lbs- has felt like gaining weight out of proportion to intake and diet. Has felt off in general in last several months- has talked to GI, cardiology about meds. No covid contacts.   ROS-not ill appearing, no fever/chills. No new medications. Not immunocompromised. No mucus membrane involvement. No lip or tongue swelling. No shortness of breath or wheeze. Sometimes eyes swell slightly.   A/P: Recurrent hives for nearly 6 weeks at this point-no clear trigger - Xyzal and Claritin are reasonable as well as Benadryl cream.  Could use famotidine if still has issues - I do think even though we are short of 6 weeks we should embark on chronic urticaria workup-see labs below - In general just has not felt well over the last several months-I think blood work is reasonable from that perspective as well - Not on many medications but did consider holding rosuvastatin at least for up to 6 weeks but will decide on this after about 2 more weeks and seeing how she progresses since she has had at least a day and a half of improved symptoms -Would have to consult with GYN about estradiol but doubt that is the cause -Not taking Bentyl regularly so unlikely cause -Also discussed potential referral to dermatology to potentially consider patch testing  #hyperlipidemia S: Medication: Rosuvastatin 20 mg daily Lab Results  Component Value Date   CHOL 297 (A) 04/02/2022   HDL 52 04/02/2022   LDLCALC 179 04/02/2022   LDLDIRECT 119.0 05/17/2022   TRIG 340 (A) 04/02/2022   CHOLHDL 5 02/24/2021   A/P: See discussion above-lipids have significantly improved and honestly would like to push even lower given already forming some coronary artery  calcifications-with that being said short-term I think it would be okay to hold off on rosuvastatin if needed  Recommended follow up: Return for as needed for new, worsening, persistent symptoms. Future Appointments  Date Time Provider [place]  05/21/2023  9:00 AM Marin Olp, MD [facility]    Lab/Order associations:   ICD-10-CM   1. Urticaria  L50.9 CBC with Differential/Platelet    Comprehensive metabolic panel    TSH    Sedimentation rate    C-reactive protein    2. Hyperlipidemia, unspecified hyperlipidemia type  E78.5      Return precautions advised.  Garret Reddish, MD

## 2022-08-28 NOTE — Patient Instructions (Addendum)
Please stop by lab before you go If you have mychart- we will send your results within 3 business days of [place] receiving them.  If you do not have mychart- we will call you about results within 5 business days of [place] receiving them.  *please also note that you will see labs on mychart as soon as they post. I will later go in and write notes on them- will say "notes from Dr. Yong Channel"   Keep me updated on this- consider dermatology consult, also considering holding statin even for up to 6 weeks  -I'm ok with you keeping your same regimen for now  How about regardless of how things go (even if great) update me in 2 weeks  Recommended follow up: Return for as needed for new, worsening, persistent symptoms.

## 2022-08-29 ENCOUNTER — Encounter: Payer: Self-pay | Admitting: Family Medicine

## 2022-08-29 DIAGNOSIS — R7401 Elevation of levels of liver transaminase levels: Secondary | ICD-10-CM

## 2022-08-29 LAB — COMPREHENSIVE METABOLIC PANEL
ALT: 41 U/L — ABNORMAL HIGH (ref 0–35)
AST: 50 U/L — ABNORMAL HIGH (ref 0–37)
Albumin: 4.6 g/dL (ref 3.5–5.2)
Alkaline Phosphatase: 61 U/L (ref 39–117)
BUN: 14 mg/dL (ref 6–23)
CO2: 31 mEq/L (ref 19–32)
Calcium: 10.2 mg/dL (ref 8.4–10.5)
Chloride: 100 mEq/L (ref 96–112)
Creatinine, Ser: 0.92 mg/dL (ref 0.40–1.20)
GFR: 70.43 mL/min (ref >60.00)
Glucose, Bld: 108 mg/dL — ABNORMAL HIGH (ref 70–99)
Potassium: 4.1 mEq/L (ref 3.5–5.1)
Sodium: 140 mEq/L (ref 135–145)
Total Bilirubin: 1.8 mg/dL — ABNORMAL HIGH (ref 0.2–1.2)
Total Protein: 7.6 g/dL (ref 6.0–8.3)

## 2022-08-29 LAB — CBC WITH DIFFERENTIAL/PLATELET
Basophils Absolute: 0.1 10*3/uL (ref 0.0–0.1)
Basophils Relative: 0.8 % (ref 0.0–3.0)
Eosinophils Absolute: 0.2 10*3/uL (ref 0.0–0.7)
Eosinophils Relative: 2.4 % (ref 0.0–5.0)
HCT: 43.7 % (ref 36.0–46.0)
Hemoglobin: 15.3 g/dL — ABNORMAL HIGH (ref 12.0–15.0)
Lymphocytes Relative: 34.8 % (ref 12.0–46.0)
Lymphs Abs: 2.3 10*3/uL (ref 0.7–4.0)
MCHC: 35 g/dL (ref 30.0–36.0)
MCV: 91.5 fl (ref 78.0–100.0)
Monocytes Absolute: 0.6 10*3/uL (ref 0.1–1.0)
Monocytes Relative: 9.6 % (ref 3.0–12.0)
Neutro Abs: 3.4 10*3/uL (ref 1.4–7.7)
Neutrophils Relative %: 52.4 % (ref 43.0–77.0)
Platelets: 310 10*3/uL (ref 150.0–400.0)
RBC: 4.77 Mil/uL (ref 3.87–5.11)
RDW: 13.5 % (ref 11.5–15.5)
WBC: 6.5 10*3/uL (ref 4.0–10.5)

## 2022-08-29 LAB — SEDIMENTATION RATE: Sed Rate: 13 mm/hr (ref 0–30)

## 2022-08-29 LAB — TSH: TSH: 0.81 u[IU]/mL (ref 0.35–5.50)

## 2022-08-29 LAB — C-REACTIVE PROTEIN: CRP: <1 mg/dL (ref 0.5–20.0)

## 2022-09-07 ENCOUNTER — Ambulatory Visit: Payer: BC Managed Care – PPO | Admitting: Family Medicine

## 2022-09-17 LAB — HM COLONOSCOPY

## 2022-09-19 ENCOUNTER — Other Ambulatory Visit: Payer: BC Managed Care – PPO

## 2022-09-20 ENCOUNTER — Ambulatory Visit
Admission: RE | Admit: 2022-09-20 | Discharge: 2022-09-20 | Disposition: A | Discharge status: Home / Self Care | Payer: BC Managed Care – PPO | Source: Ambulatory Visit | Attending: Family Medicine | Admitting: Family Medicine

## 2022-09-20 DIAGNOSIS — R7401 Elevation of levels of liver transaminase levels: Secondary | ICD-10-CM

## 2023-03-08 ENCOUNTER — Ambulatory Visit (INDEPENDENT_AMBULATORY_CARE_PROVIDER_SITE_OTHER): Payer: BC Managed Care – PPO | Admitting: Family Medicine

## 2023-03-08 ENCOUNTER — Encounter: Payer: Self-pay | Admitting: Family Medicine

## 2023-03-08 VITALS — BP 110/70 | HR 76 | Temp 98.0°F | Ht 69.0 in | Wt 162.0 lb

## 2023-03-08 DIAGNOSIS — E7800 Pure hypercholesterolemia, unspecified: Secondary | ICD-10-CM | POA: Diagnosis not present

## 2023-03-08 DIAGNOSIS — R5383 Other fatigue: Secondary | ICD-10-CM | POA: Diagnosis not present

## 2023-03-08 DIAGNOSIS — L509 Urticaria, unspecified: Secondary | ICD-10-CM | POA: Diagnosis not present

## 2023-03-08 DIAGNOSIS — E559 Vitamin D deficiency, unspecified: Secondary | ICD-10-CM | POA: Diagnosis not present

## 2023-03-08 LAB — CBC WITH DIFFERENTIAL/PLATELET
Basophils Absolute: 0 10*3/uL (ref 0.0–0.1)
Basophils Relative: 0.9 % (ref 0.0–3.0)
Eosinophils Absolute: 0.2 10*3/uL (ref 0.0–0.7)
Eosinophils Relative: 3.3 % (ref 0.0–5.0)
HCT: 44.4 % (ref 36.0–46.0)
Hemoglobin: 14.7 g/dL (ref 12.0–15.0)
Lymphocytes Relative: 30.8 % (ref 12.0–46.0)
Lymphs Abs: 1.6 10*3/uL (ref 0.7–4.0)
MCHC: 33.2 g/dL (ref 30.0–36.0)
MCV: 93.4 fl (ref 78.0–100.0)
Monocytes Absolute: 0.4 10*3/uL (ref 0.1–1.0)
Monocytes Relative: 8.3 % (ref 3.0–12.0)
Neutro Abs: 2.9 10*3/uL (ref 1.4–7.7)
Neutrophils Relative %: 56.7 % (ref 43.0–77.0)
Platelets: 370 10*3/uL (ref 150.0–400.0)
RBC: 4.75 Mil/uL (ref 3.87–5.11)
RDW: 13.2 % (ref 11.5–15.5)
WBC: 5.1 10*3/uL (ref 4.0–10.5)

## 2023-03-08 LAB — T4, FREE: Free T4: 0.62 ng/dL (ref 0.60–1.60)

## 2023-03-08 LAB — LIPID PANEL
Cholesterol: 323 mg/dL — ABNORMAL HIGH (ref 0–200)
HDL: 52.6 mg/dL (ref >39.00)
NonHDL: 270.68
Total CHOL/HDL Ratio: 6
Triglycerides: 364 mg/dL — ABNORMAL HIGH (ref 0.0–149.0)
VLDL: 72.8 mg/dL — ABNORMAL HIGH (ref 0.0–40.0)

## 2023-03-08 LAB — COMPREHENSIVE METABOLIC PANEL
ALT: 12 U/L (ref 0–35)
AST: 15 U/L (ref 0–37)
Albumin: 4.2 g/dL (ref 3.5–5.2)
Alkaline Phosphatase: 61 U/L (ref 39–117)
BUN: 16 mg/dL (ref 6–23)
CO2: 30 mEq/L (ref 19–32)
Calcium: 9.6 mg/dL (ref 8.4–10.5)
Chloride: 101 mEq/L (ref 96–112)
Creatinine, Ser: 0.98 mg/dL (ref 0.40–1.20)
GFR: 65.05 mL/min (ref >60.00)
Glucose, Bld: 101 mg/dL — ABNORMAL HIGH (ref 70–99)
Potassium: 3.9 mEq/L (ref 3.5–5.1)
Sodium: 138 mEq/L (ref 135–145)
Total Bilirubin: 1.1 mg/dL (ref 0.2–1.2)
Total Protein: 7 g/dL (ref 6.0–8.3)

## 2023-03-08 LAB — VITAMIN D 25 HYDROXY (VIT D DEFICIENCY, FRACTURES): VITD: 38 ng/mL (ref 30.00–100.00)

## 2023-03-08 LAB — TSH: TSH: 1.3 u[IU]/mL (ref 0.35–5.50)

## 2023-03-08 LAB — CORTISOL: Cortisol, Plasma: 13.4 ug/dL

## 2023-03-08 LAB — C-REACTIVE PROTEIN: CRP: <1 mg/dL (ref 0.5–20.0)

## 2023-03-08 LAB — LDL CHOLESTEROL, DIRECT: Direct LDL: 207 mg/dL

## 2023-03-08 LAB — T3, FREE: T3, Free: 3.4 pg/mL (ref 2.3–4.2)

## 2023-03-08 LAB — VITAMIN B12: Vitamin B-12: 397 pg/mL (ref 211–911)

## 2023-03-08 NOTE — Progress Notes (Signed)
Phone [phone] In person visit   Subjective:   [name] is a 57 y.o. year old very pleasant female patient who presents for/with See problem oriented charting Chief Complaint  Patient presents with   Urticaria    Pt is here to f/u on hives, states they still come and go, thought cholesterol meds was the cause. Has talked to allergist.   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 10/13/2020    Priority: High   S/P complete hysterectomy 05/17/2022    Priority: Medium    Tinnitus aurium, bilateral 01/18/2021    Priority: Medium    Chronic tension-type headache, intractable 10/13/2020    Priority: Medium    Occipital headache 10/13/2020    Priority: Medium    Fatty liver 09/19/2020    Priority: Medium    Pure hypercholesterolemia 07/07/2019    Priority: Medium    Postsurgical menopause 08/13/2009    Priority: Medium    Allergic rhinitis 11/03/2008    Priority: Medium    Photophobia of both eyes 01/18/2021    Priority: Low   Orthostatic hypertension 10/13/2020    Priority: Low   Dysphonia 10/13/2020    Priority: Low   Diverticular disease of colon 09/19/2020    Priority: Low   Cavernous hemangioma of liver 08/21/2019    Priority: Low   Thyroid nodule 08/21/2019    Priority: Low   Herpes simplex 07/07/2019    Priority: Low   GERD (gastroesophageal reflux disease) 07/07/2019    Priority: Low   Overactive bladder 07/07/2019    Priority: Low   Situational depression 11/03/2008    Priority: Low   Vitamin D deficiency 03/08/2023   Right cervical radiculopathy 08/22/2021   Cervico-occipital neuralgia of the  right side 08/22/2021    Medications- reviewed and updated Current Outpatient Medications  Medication Sig Dispense Refill   albuterol (VENTOLIN HFA) 108 (90 Base) MCG/ACT inhaler Inhale 2 puffs into the lungs every 4 (four) hours as needed.     dicyclomine (BENTYL) 20 MG tablet Take 1 tablet (20 mg total) by mouth 3 (three) times daily as needed for spasms. And diarrhea 30 tablet 0   estradiol (ESTRACE) 1 MG tablet Take 1 mg by mouth daily.     valACYclovir (VALTREX) 1000 MG tablet Take two tablets ( total 2000 mg) by mouth q12h x 1 day; Start: ASAP after symptom onset 30 tablet 0   rosuvastatin (CRESTOR) 20 MG tablet Take 1 tablet (20 mg total) by mouth daily. (Patient not taking: Reported on 03/08/2023) 90 tablet 3   No current facility-administered medications for this visit.     Objective:  BP 110/70   Pulse 76   Temp 98 F (36.7 C)   Ht 5\' 9"  (1.753 m)   Wt 162 lb (73.5 kg)   SpO2 96%   BMI 23.92 kg/m  Gen: NAD, resting comfortably Prominent sternocleidomastoid versus mild thyromegaly CV: RRR no murmurs rubs or gallops Lungs: CTAB no crackles, wheeze, rhonchi Ext: no edema Skin: warm, dry, no hives on exam today    Assessment and Plan   # Urticaria intermittent recurrent S: Patient reports she  is here to follow-up on hives.  We discussed this at August 28, 2022 visit at which point she was having 6 weeks of recurrent hives without clear trigger.  We discussed using Xyzal and Claritin as well as Benadryl cream.  Discussed using famotidine as an add-on.  Chronic urticaria labs were obtained including CBC, CMP, CRP, sed rate, TSH-did have mild LFT elevations and already status postcholecystectomy.  Even before seeing labs back we considered holding rosuvastatin.  We also mentioned discussing estradiol with GYN.  Had mentioned patch testing with dermatology or allergist -We did do a right upper quadrant ultrasound on 09/20/2022 which interestingly enough showed fatty liver despite  normal weight.  Common bile duct at 10 mm which could be an effect of cholecystectomy -On 02/19/2023 she ended up seeing [facility] allergy Dr. Dominic Pea only mention seasonal allergies to continue Claritin and nasal saline irrigations as well as continuing albuterol for mild persistent asthma. She reports they discussed this briefly and was told could be very random from different sensitivities. She is not sure if they do patch testing- has dermatology visit on . Had a friend that was found that had a mouth rash related to an ingredient in makeup -she talked to gynecology and they said not related.  -intermittent still/random so not sure if any improvement off rosuvastatin- can really be anywhere on the body as well. Majority on trunk of body.   Area seem to come and go.  Initially she thought it could be related to cholesterol medicine.  She has spoken with an allergist and reports in August.   A/P: Intermittent recurrent urticaria along with ongoing fatigue - Still with unclear cause but I do think patch testing can be beneficial-she is to discuss with dermatologist - Update labs as discussed extensively with patient and listed below to look for potential causes-in particular she is interested in evaluating her thyroid more - Also asked for cortisol levels -She has a prominent sternocleidomastoid versus mild thyromegaly-consider ultrasound of the thyroid but she declines for now-will call us back if she changes her mind and we discussed certainly moving forward if thyroid levels were off at all  #hyperlipidemia- Dr. Eden Emms 06/27/2022-Calcium score only 3 isolated to [place] but high for age as most people age 49 have scores of 0 Continue crestor  S: Medication: stopped her statin and plan was to recheck LFTs with prior elevation but other appointment was cancelled and she is open to doing this today Lab Results  Component Value Date   CHOL 297 (A) 04/02/2022   HDL 52 04/02/2022   LDLCALC 179  04/02/2022   LDLDIRECT 119.0 05/17/2022   TRIG 340 (A) 04/02/2022   CHOLHDL 5 02/24/2021   A/P: Suspect lipids significantly elevated off of statin-hopefully LFTs have improved and can potentially restart at a lower level and recheck LFTs in 6 weeks   #Vitamin D deficiency- reports was told low years ago S: Medication: None at present Last vitamin D Lab Results  Component Value Date   VD25OH 32 07/07/2019   A/P: She was considering restarting vitamin D but wanted to have this checked before starting to check on status  # With recurrent hives wants to hold off on Shingrix at this time  Recommended follow up: Return for next already scheduled visit or sooner if needed. Future Appointments  Date Time Provider Department Center  09/06/2023 10:00 AM Shelva Majestic, MD [facility]   Lab/Order associations: fasting    ICD-10-CM   1. Urticaria  L50.9 TSH    T4, free    T3, free    Comprehensive metabolic panel    CBC with Differential/Platelet    Cortisol    C-reactive protein    2. Pure hypercholesterolemia  E78.00 Lipid panel    3. Fatigue, unspecified type  R53.83 Vitamin B12    VITAMIN D 25 Hydroxy (Vit-D Deficiency, Fractures)    4. Vitamin D deficiency  E55.9 VITAMIN D 25 Hydroxy (Vit-D Deficiency, Fractures)     No orders of the defined types were placed in this encounter.  Return precautions advised.  Tana Conch, MD

## 2023-03-08 NOTE — Patient Instructions (Addendum)
Please stop by lab before you go If you have mychart- we will send your results within 3 business days of [place] receiving them.  If you do not have mychart- we will call you about results within 5 business days of [place] receiving them.  *please also note that you will see labs on mychart as soon as they post. I will later go in and write notes on them- will say "notes from Dr. Durene Cal"   Consider patch testing with dermatology  Could try elimination diet   If you change your mind on thyroid ultrasound let us know but I think this is just a prominent sternocleidomastoid muscle muscle  Recommended follow up: Return for next already scheduled visit or sooner if needed.

## 2023-04-16 ENCOUNTER — Encounter: Payer: Self-pay | Admitting: Family Medicine

## 2023-04-16 DIAGNOSIS — E010 Iodine-deficiency related diffuse (endemic) goiter: Secondary | ICD-10-CM

## 2023-04-19 ENCOUNTER — Ambulatory Visit
Admission: RE | Admit: 2023-04-19 | Discharge: 2023-04-19 | Disposition: A | Discharge status: Home / Self Care | Payer: BC Managed Care – PPO | Source: Ambulatory Visit | Attending: Family Medicine | Admitting: Family Medicine

## 2023-04-19 DIAGNOSIS — E010 Iodine-deficiency related diffuse (endemic) goiter: Secondary | ICD-10-CM

## 2023-04-22 ENCOUNTER — Encounter: Payer: Self-pay | Admitting: Family Medicine

## 2023-04-24 ENCOUNTER — Other Ambulatory Visit: Payer: Self-pay

## 2023-04-24 DIAGNOSIS — E041 Nontoxic single thyroid nodule: Secondary | ICD-10-CM

## 2023-04-24 DIAGNOSIS — E010 Iodine-deficiency related diffuse (endemic) goiter: Secondary | ICD-10-CM

## 2023-04-24 NOTE — Addendum Note (Signed)
Addended by: Shelva Majestic on: 04/24/2023 02:20 PM   Modules accepted: Orders

## 2023-04-25 ENCOUNTER — Other Ambulatory Visit: Payer: BC Managed Care – PPO

## 2023-04-25 DIAGNOSIS — E041 Nontoxic single thyroid nodule: Secondary | ICD-10-CM

## 2023-04-25 DIAGNOSIS — E010 Iodine-deficiency related diffuse (endemic) goiter: Secondary | ICD-10-CM

## 2023-04-29 LAB — THYROID PEROXIDASE ANTIBODIES (TPO) (REFL): Thyroperoxidase Ab SerPl-aCnc: 1 [IU]/mL (ref <9)

## 2023-05-21 ENCOUNTER — Encounter: Payer: BC Managed Care – PPO | Admitting: Family Medicine

## 2023-05-22 LAB — HM MAMMOGRAPHY

## 2023-06-05 ENCOUNTER — Ambulatory Visit: Payer: BC Managed Care – PPO | Admitting: Physician Assistant

## 2023-06-05 ENCOUNTER — Encounter: Payer: Self-pay | Admitting: Physician Assistant

## 2023-06-05 VITALS — BP 120/80 | HR 105 | Temp 97.8°F | Ht 69.0 in | Wt 165.2 lb

## 2023-06-05 DIAGNOSIS — R051 Acute cough: Secondary | ICD-10-CM | POA: Diagnosis not present

## 2023-06-05 MED ORDER — PSEUDOEPH-BROMPHEN-DM 30-2-10 MG/5ML PO SYRP: 5.0000 mL | ORAL_SOLUTION | Freq: Four times a day (QID) | ORAL | 0 refills | Status: DC | PRN

## 2023-06-05 MED ORDER — AZITHROMYCIN 250 MG PO TABS: ORAL_TABLET | ORAL | 0 refills | Status: AC

## 2023-06-05 NOTE — Patient Instructions (Signed)
It was great to see you!  Start azithromycin antibiotic(s) for your sinus infection  Use bromfed as needed for your cough as directed, may hold claritin while on this if too drying  Push fluids and get plenty of rest.  Please return if you are not improving as expected, or if you have high fevers (>101.5), shortness of breath, chest pain or difficulty swallowing or worsening productive cough.  Call clinic with questions.  I hope you start feeling better soon!

## 2023-06-05 NOTE — Progress Notes (Signed)
[name] is a 57 y.o. female here for a new problem.  History of Present Illness:   Chief Complaint  Patient presents with   Cough    Pt c/o cough and chest congestion x 1 week, started coughing and expectorating yellow sputum, not sleeping due to cough. Denies fever or chills. She has been using Mucinex, claritin. Head and chest hurt from coughing and teeth are starting hurst, ear pain left > right.    Upper Respiratory Infection She has been experiencing chest congestion, post-nasal drip, sore throat, headache, cough with yellow sputum since 05/29/23.  Prior to symptom onset, she was walking [place] trails with ragweed and believes this may have triggered her allergies. Denies shortness of breath, chest pain with exertion, swelling in legs. She used Afrin for three days and is now using saline nasal spray, Bromfed, ibuprofen with some relief. Reports negative at-home Covid-19 test.  Past Medical History:  Diagnosis Date   Allergy    Coronary artery calcification    CT 07/2019: Ca score 3   Diverticulosis    hx of diverticulitis    Echocardiogram 12/2020   Echo 5/22: EF 60-65, no RWMA, GLS -21.0%, normal RVSF, mild MR, no pericardial effusion   GERD (gastroesophageal reflux disease)    History of chickenpox    Hyperlipidemia    Wears eyeglasses    dry eyes and cornea change      Social History   Tobacco Use   Smoking status: Never   Smokeless tobacco: Never  Vaping Use   Vaping status: Never Used  Substance Use Topics   Alcohol use: Yes    Comment: social- less than 1 a week   Drug use: Never    Past Surgical History:  Procedure Laterality Date   ABDOMINAL HYSTERECTOMY     BLADDER SUSPENSION     CHOLECYSTECTOMY     LAPAROSCOPIC ENDOMETRIOSIS FULGURATION     3 separate surgeries    Family History  Problem Relation Age of Onset   Hyperlipidemia Mother    Hypertension Mother    Glaucoma Mother    Memory loss Mother        being evaluated  for alzheimers   Diabetes Father    Hypertension Father    Cancer Father        Melanoma   Dementia Father    Lymphoma Father        MALT- was told not hereditary   Colon polyps Sister    Cancer Sister        Skin   Parkinson's disease Maternal Grandfather    Other Son        PANDAS    Allergies  Allergen Reactions   Ciprofloxacin Hives and Nausea And Vomiting   Codeine    Doxycycline Itching   Iodine    Penicillins     REACTION: hives   Sulfonamide Derivatives     REACTION: rash    Current Medications:   Current Outpatient Medications:    albuterol (VENTOLIN HFA) 108 (90 Base) MCG/ACT inhaler, Inhale 2 puffs into the lungs every 4 (four) hours as needed., Disp: , Rfl:    azithromycin (ZITHROMAX) 250 MG tablet, Take 2 tablets on day 1, then 1 tablet daily on days 2 through 5, Disp: 6 tablet, Rfl: 0   brompheniramine-pseudoephedrine-DM 30-2-10 MG/5ML syrup, Take 5 mLs by mouth 4 (four) times daily as needed., Disp: 120 mL, Rfl: 0   dicyclomine (BENTYL) 20 MG tablet, Take 1 tablet (20 mg  total) by mouth 3 (three) times daily as needed for spasms. And diarrhea, Disp: 30 tablet, Rfl: 0   estradiol (ESTRACE) 1 MG tablet, Take 1 mg by mouth daily., Disp: , Rfl:    loratadine (CLARITIN) 10 MG tablet, Take 10 mg by mouth daily as needed., Disp: , Rfl:    valACYclovir (VALTREX) 1000 MG tablet, Take two tablets ( total 2000 mg) by mouth q12h x 1 day; Start: ASAP after symptom onset, Disp: 30 tablet, Rfl: 0   Review of Systems:   Review of Systems  Constitutional:  Negative for fever and malaise/fatigue.  HENT:  Positive for congestion (Chest) and sore throat.        (+) Post-nasal drip  Eyes:  Negative for blurred vision.  Respiratory:  Positive for cough and sputum production (Yellow). Negative for shortness of breath.   Cardiovascular:  Negative for chest pain, palpitations and leg swelling.  Gastrointestinal:  Negative for vomiting.  Musculoskeletal:  Negative for back pain.   Skin:  Negative for rash.  Neurological:  Positive for headaches. Negative for loss of consciousness.    Vitals:   Vitals:   06/05/23 1140  BP: 120/80  Pulse: (!) 105  Temp: 97.8 F (36.6 C)  TempSrc: Temporal  SpO2: 98%  Weight: 165 lb 4 oz (75 kg)  Height: 5\' 9"  (1.753 m)     Body mass index is 24.4 kg/m.  Physical Exam:   Physical Exam Vitals and nursing note reviewed.  Constitutional:      General: She is not in acute distress.    Appearance: She is well-developed. She is not ill-appearing or toxic-appearing.  HENT:     Head: Normocephalic and atraumatic.     Right Ear: Ear canal and external ear normal. A middle ear effusion is present. Tympanic membrane is not erythematous, retracted or bulging.     Left Ear: Ear canal and external ear normal. A middle ear effusion is present. Tympanic membrane is not erythematous, retracted or bulging.     Nose:     Right Sinus: Frontal sinus tenderness present. No maxillary sinus tenderness.     Left Sinus: Frontal sinus tenderness present. No maxillary sinus tenderness.     Mouth/Throat:     Pharynx: Uvula midline. No posterior oropharyngeal erythema.  Eyes:     General: Lids are normal.     Conjunctiva/sclera: Conjunctivae normal.  Neck:     Trachea: Trachea normal.  Cardiovascular:     Rate and Rhythm: Normal rate and regular rhythm.     Heart sounds: Normal heart sounds, S1 normal and S2 normal.  Pulmonary:     Effort: Pulmonary effort is normal.     Breath sounds: Normal breath sounds. No decreased breath sounds, wheezing, rhonchi or rales.  Lymphadenopathy:     Cervical: No cervical adenopathy.  Skin:    General: Skin is warm and dry.  Neurological:     Mental Status: She is alert.  Psychiatric:        Speech: Speech normal.        Behavior: Behavior normal. Behavior is cooperative.     Assessment and Plan:   Acute cough No red flags on exam.  Will initiate azithromycin and bromfed cough syrup per orders.  Discussed taking medications as prescribed. Reviewed return precautions including worsening fever, SOB, worsening cough or other concerns. Push fluids and rest. I recommend that patient follow-up if symptoms worsen or persist despite treatment x 7-10 days, sooner if needed.    I,Alexander  Ruley,acting as a Neurosurgeon for [employer], PA.,have documented all relevant documentation on the behalf of Jarold Motto, PA,as directed by  Jarold Motto, PA while in the presence of Jarold Motto, [place].   I, Jarold Motto, [place], have reviewed all documentation for this visit. The documentation on 06/05/23 for the exam, diagnosis, procedures, and orders are all accurate and complete.    Jarold Motto, PA-C

## 2023-08-16 ENCOUNTER — Encounter: Payer: Self-pay | Admitting: Family Medicine

## 2023-08-16 ENCOUNTER — Other Ambulatory Visit: Payer: Self-pay | Admitting: Family

## 2023-08-16 DIAGNOSIS — R1084 Generalized abdominal pain: Secondary | ICD-10-CM

## 2023-08-16 MED ORDER — DICYCLOMINE HCL 20 MG PO TABS
20.0000 mg | ORAL_TABLET | Freq: Three times a day (TID) | ORAL | 0 refills | Status: DC | PRN
Start: 2023-08-16 — End: 2023-09-10

## 2023-08-16 NOTE — Telephone Encounter (Signed)
**Note De-identified  [place] Obfuscation** Please advise 

## 2023-09-02 ENCOUNTER — Encounter: Payer: Self-pay | Admitting: Family Medicine

## 2023-09-03 ENCOUNTER — Telehealth: Payer: BC Managed Care – PPO | Admitting: Physician Assistant

## 2023-09-03 DIAGNOSIS — R6889 Other general symptoms and signs: Secondary | ICD-10-CM

## 2023-09-03 MED ORDER — ALBUTEROL SULFATE HFA 108 (90 BASE) MCG/ACT IN AERS
1.0000 | INHALATION_SPRAY | Freq: Four times a day (QID) | RESPIRATORY_TRACT | 0 refills | Status: AC | PRN
Start: 2023-09-03 — End: ?

## 2023-09-03 MED ORDER — OSELTAMIVIR PHOSPHATE 75 MG PO CAPS
75.0000 mg | ORAL_CAPSULE | Freq: Two times a day (BID) | ORAL | 0 refills | Status: DC
Start: 2023-09-03 — End: 2023-09-11

## 2023-09-03 MED ORDER — PSEUDOEPH-BROMPHEN-DM 30-2-10 MG/5ML PO SYRP
5.0000 mL | ORAL_SOLUTION | Freq: Four times a day (QID) | ORAL | 0 refills | Status: DC | PRN
Start: 2023-09-03 — End: 2023-09-11

## 2023-09-03 NOTE — Progress Notes (Signed)
Virtual Visit Consent   [name], you are scheduled for a virtual visit with a [facility] provider today. Just as with appointments in the office, your consent must be obtained to participate. Your consent will be active for this visit and any virtual visit you may have with one of our providers in the next 365 days. If you have a MyChart account, a copy of this consent can be sent to you electronically.  As this is a virtual visit, video technology does not allow for your provider to perform a traditional examination. This may limit your provider's ability to fully assess your condition. If your provider identifies any concerns that need to be evaluated in person or the need to arrange testing (such as labs, EKG, etc.), we will make arrangements to do so. Although advances in technology are sophisticated, we cannot ensure that it will always work on either your end or our end. If the connection with a video visit is poor, the visit may have to be switched to a telephone visit. With either a video or telephone visit, we are not always able to ensure that we have a secure connection.  By engaging in this virtual visit, you consent to the provision of healthcare and authorize for your insurance to be billed (if applicable) for the services provided during this visit. Depending on your insurance coverage, you may receive a charge related to this service.  I need to obtain your verbal consent now. Are you willing to proceed with your visit today? [name] has provided verbal consent on 09/03/2023 for a virtual visit (video or telephone). Margaretann Loveless, PA-C  Date: 09/03/2023 11:46 AM  Virtual Visit via Video Note   I, Margaretann Loveless, connected with  [name]  ([id number], 07-Feb-1966) on 09/03/23 at 11:45 AM EST by a video-enabled telemedicine application and verified that I am speaking with the correct person using two identifiers.  Location: Patient:  Virtual Visit Location Patient: Home Provider: Virtual Visit Location Provider: Home Office   I discussed the limitations of evaluation and management by telemedicine and the availability of in person appointments. The patient expressed understanding and agreed to proceed.    History of Present Illness: [name] is a 58 y.o. who identifies as a female who was assigned female at birth, and is being seen today for flu-like symptoms.  HPI: URI  This is a new problem. The current episode started yesterday. The problem has been gradually worsening. Maximum temperature: subjective. Associated symptoms include congestion, coughing (dry), diarrhea, ear pain (left), headaches, nausea, rhinorrhea (post nasal drainage), sinus pain and a sore throat. Pertinent negatives include no plugged ear sensation, vomiting or wheezing. Associated symptoms comments: Hoarse voice, decreased appetite, chills, body aches. She has tried acetaminophen and increased fluids (bromfed dm) for the symptoms. The treatment provided no relief.  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 01/18/2021   Postviral syndrome 10/13/2020   Orthostatic hypertension 10/13/2020   Chronic tension-type headache, intractable 10/13/2020   Occipital headache  10/13/2020   Dysphonia 10/13/2020   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 10/13/2020   Diverticular disease of colon 09/19/2020   Fatty liver 09/19/2020   Cavernous hemangioma of liver  08/21/2019   Thyroid nodule 08/21/2019   Pure hypercholesterolemia 07/07/2019   Herpes simplex 07/07/2019   GERD (gastroesophageal reflux disease) 07/07/2019   Overactive bladder 07/07/2019   Postsurgical menopause 08/13/2009   Situational depression 11/03/2008   Allergic rhinitis 11/03/2008    Allergies:  Allergies  Allergen Reactions   Ciprofloxacin Hives and Nausea And Vomiting   Codeine    Doxycycline Itching   Iodine    Penicillins     REACTION: hives   Sulfonamide Derivatives     REACTION: rash   Medications:  Current Outpatient Medications:    albuterol (VENTOLIN HFA) 108 (90 Base) MCG/ACT inhaler, Inhale 1-2 puffs into the lungs every 6 (six) hours as needed., Disp: 8 g, Rfl: 0   brompheniramine-pseudoephedrine-DM 30-2-10 MG/5ML syrup, Take 5 mLs by mouth 4 (four) times daily as needed., Disp: 120 mL, Rfl: 0   oseltamivir (TAMIFLU) 75 MG capsule, Take 1 capsule (75 mg total) by mouth 2 (two) times daily., Disp: 10 capsule, Rfl: 0   dicyclomine (BENTYL) 20 MG tablet, Take 1 tablet (20 mg total) by mouth 3 (three) times daily as needed for spasms. And diarrhea, Disp: 30 tablet, Rfl: 0   estradiol (ESTRACE) 1 MG tablet, Take 1 mg by mouth daily., Disp: , Rfl:    loratadine (CLARITIN) 10 MG tablet, Take 10 mg by mouth daily as needed., Disp: , Rfl:    valACYclovir (VALTREX) 1000 MG tablet, Take two tablets ( total 2000 mg) by mouth q12h x 1 day; Start: ASAP after symptom onset, Disp: 30 tablet, Rfl: 0  Observations/Objective: Patient is well-developed, well-nourished in no acute distress.  Resting comfortably at home.  Head is normocephalic, atraumatic.  No labored breathing.  Speech is clear and coherent with logical content.  Patient is alert and oriented at baseline.    Assessment and Plan: 1. Flu-like symptoms (Primary) - oseltamivir (TAMIFLU) 75 MG capsule; Take 1 capsule (75 mg total) by mouth 2 (two) times daily.  Dispense: 10 capsule; Refill: 0 -  brompheniramine-pseudoephedrine-DM 30-2-10 MG/5ML syrup; Take 5 mLs by mouth 4 (four) times daily as needed.  Dispense: 120 mL; Refill: 0 - albuterol (VENTOLIN HFA) 108 (90 Base) MCG/ACT inhaler; Inhale 1-2 puffs into the lungs every 6 (six) hours as needed.  Dispense: 8 g; Refill: 0  - Suspected viral URI with negative covid testing; possible flu exposure - Possible flu as flu A is prevalent in the community at this time - Limited testing availability that would delay appropriate treatment waiting on results - Tamiflu prescribed for possible flu - Bromfed DM for cough - Albuterol for shortness of breath or wheezing - Push fluids - Symptomatic management OTC of choice as needed - Seek in person evaluation if symptoms worsen or fail to improve   Follow Up Instructions: I discussed the assessment and treatment plan with the patient. The patient was provided an opportunity to ask questions and all were answered. The patient agreed with the plan and demonstrated an understanding of the instructions.  A copy of instructions were sent to the patient via MyChart unless otherwise noted below.    The patient was advised to call back or seek an in-person evaluation if the symptoms worsen or if the condition fails to improve as anticipated.    Margaretann Loveless, PA-C

## 2023-09-03 NOTE — Patient Instructions (Signed)
[name], thank you for joining Margaretann Loveless, PA-C for today's virtual visit.  While this provider is not your primary care provider (PCP), if your PCP is located in our provider database this encounter information will be shared with them immediately following your visit.   A [facility] MyChart account gives you access to today's visit and all your visits, tests, and labs performed at [facility] " click here if you don't have a [facility] MyChart account or go to [email or web address]  Consent: (Patient) [name] provided verbal consent for this virtual visit at the beginning of the encounter.  Current Medications:  Current Outpatient Medications:    albuterol (VENTOLIN HFA) 108 (90 Base) MCG/ACT inhaler, Inhale 1-2 puffs into the lungs every 6 (six) hours as needed., Disp: 8 g, Rfl: 0   brompheniramine-pseudoephedrine-DM 30-2-10 MG/5ML syrup, Take 5 mLs by mouth 4 (four) times daily as needed., Disp: 120 mL, Rfl: 0   oseltamivir (TAMIFLU) 75 MG capsule, Take 1 capsule (75 mg total) by mouth 2 (two) times daily., Disp: 10 capsule, Rfl: 0   dicyclomine (BENTYL) 20 MG tablet, Take 1 tablet (20 mg total) by mouth 3 (three) times daily as needed for spasms. And diarrhea, Disp: 30 tablet, Rfl: 0   estradiol (ESTRACE) 1 MG tablet, Take 1 mg by mouth daily., Disp: , Rfl:    loratadine (CLARITIN) 10 MG tablet, Take 10 mg by mouth daily as needed., Disp: , Rfl:    valACYclovir (VALTREX) 1000 MG tablet, Take two tablets ( total 2000 mg) by mouth q12h x 1 day; Start: ASAP after symptom onset, Disp: 30 tablet, Rfl: 0   Medications ordered in this encounter:  Meds ordered this encounter  Medications   oseltamivir (TAMIFLU) 75 MG capsule    Sig: Take 1 capsule (75 mg total) by mouth 2 (two) times daily.    Dispense:  10 capsule    Refill:  0    Supervising Provider:   Merrilee Jansky [[id number]]   brompheniramine-pseudoephedrine-DM 30-2-10 MG/5ML syrup     Sig: Take 5 mLs by mouth 4 (four) times daily as needed.    Dispense:  120 mL    Refill:  0    Supervising Provider:   Merrilee Jansky [[id number]]   albuterol (VENTOLIN HFA) 108 (90 Base) MCG/ACT inhaler    Sig: Inhale 1-2 puffs into the lungs every 6 (six) hours as needed.    Dispense:  8 g    Refill:  0    Supervising Provider:   Merrilee Jansky [[id number]]     *If you need refills on other medications prior to your next appointment, please contact your pharmacy*  Follow-Up: Call back or seek an in-person evaluation if the symptoms worsen or if the condition fails to improve as anticipated.  [facility] [phone]  Other Instructions Influenza, Adult Influenza is also called the flu. It's an infection that affects your respiratory tract. This includes your nose, throat, windpipe, and lungs. The flu is contagious. This means it spreads easily from person to person. It causes symptoms that are like a cold. It can also cause a high fever and body aches. What are the causes? The flu is caused by the influenza virus. You can get it by: Breathing in droplets that are in the air after an infected person coughs or sneezes. Touching something that has the virus on it and then touching your mouth, nose, or eyes. What increases the risk? You  may be more likely to get the flu if: You don't wash your hands often. You're near a lot of people during cold and flu season. You touch your mouth, eyes, or nose without washing your hands first. You don't get a flu shot each year. You may also be more at risk for the flu and serious problems, such as a lung infection called pneumonia, if: You're older than 65. You're pregnant. Your immune system is weak. Your immune system is your body's defense system. You have a long-term, or chronic, condition, such as: Heart, kidney, or lung disease. Diabetes. A liver disorder. Asthma. You're very overweight. You have anemia. This is  when you don't have enough red blood cells in your body. What are the signs or symptoms? Flu symptoms often start all of a sudden. They may last 4-14 days and include: Fever and chills. Headaches, body aches, or muscle aches. Sore throat. Cough. Runny or stuffy nose. Discomfort in your chest. Not wanting to eat as much as normal. Feeling weak or tired. Feeling dizzy. Nausea or vomiting. How is this diagnosed? The flu may be diagnosed based on your symptoms and medical history. You may also have a physical exam. A swab may be taken from your nose or throat and tested for the virus. How is this treated? If the flu is found early, you can be treated with antiviral medicine. This may be given to you by mouth or through an IV. It can help you feel less sick and get better faster. Taking care of yourself at home can also help your symptoms get better. Your health care provider may tell you to: Take over-the-counter medicines. Drink lots of fluids. The flu often goes away on its own. If you have very bad symptoms or problems caused by the flu, you may need to be treated in a hospital. Follow these instructions at home: Activity Rest as needed. Get lots of sleep. Stay home from work or school as told by your provider. Leave home only to go see your provider. Do not leave home for other reasons until you don't have a fever for 24 hours without taking medicine. Eating and drinking Take an oral rehydration solution (ORS). This is a drink that is sold at pharmacies and stores. Drink enough fluid to keep your pee pale yellow. Try to drink small amounts of clear fluids. These include water, ice chips, fruit juice mixed with water, and low-calorie sports drinks. Try to eat bland foods that are easy to digest. These include bananas, applesauce, rice, lean meats, toast, and crackers. Avoid drinks that have a lot of sugar or caffeine in them. These include energy drinks, regular sports drinks, and  soda. Do not drink alcohol. Do not eat spicy or fatty foods. General instructions     Take your medicines only as told by your provider. Use a cool mist humidifier to add moisture to the air in your home. This can make it easier for you to breathe. You should also clean the humidifier every day. To do so: Empty the water. Pour clean water in. Cover your mouth and nose when you cough or sneeze. Wash your hands with soap and water often and for at least 20 seconds. It's extra important to do so after you cough or sneeze. If you can't use soap and water, use hand sanitizer. How is this prevented?  Get a flu shot every year. Ask your provider when you should get your flu shot. Stay away from people who  are sick during fall and winter. Fall and winter are cold and flu season. Contact a health care provider if: You get new symptoms. You have chest pain. You have watery poop, also called diarrhea. You have a fever. Your cough gets worse. You start to have more mucus. You feel like you may vomit, or you vomit. Get help right away if: You become short of breath or have trouble breathing. Your skin or nails turn blue. You have very bad pain or stiffness in your neck. You get a sudden headache or pain in your face or ear. You vomit each time you eat or drink. These symptoms may be an emergency. Call 911 right away. Do not wait to see if the symptoms will go away. Do not drive yourself to the hospital. This information is not intended to replace advice given to you by your health care provider. Make sure you discuss any questions you have with your health care provider. Document Revised: 05/02/2023 Document Reviewed: 09/06/2022 Elsevier Patient Education  2024 Elsevier Inc.   If you have been instructed to have an in-person evaluation today at a local Urgent Care facility, please use the link below. It will take you to a list of all of our available [facility], including  address, phone number and hours of operation. Please do not delay care.  [facility]  If you or a family member do not have a primary care provider, use the link below to schedule a visit and establish care. When you choose a [facility] primary care physician or advanced practice provider, you gain a long-term partner in health. Find a Primary Care Provider  Learn more about [facility] in-office and virtual care options: [facility] - Get Care Now

## 2023-09-05 ENCOUNTER — Encounter: Payer: Self-pay | Admitting: Family Medicine

## 2023-09-05 NOTE — Telephone Encounter (Signed)
Patient has been rescheduled no further actions required

## 2023-09-06 ENCOUNTER — Encounter: Payer: BC Managed Care – PPO | Admitting: Family Medicine

## 2023-09-10 ENCOUNTER — Other Ambulatory Visit: Payer: Self-pay | Admitting: Family Medicine

## 2023-09-10 DIAGNOSIS — R1084 Generalized abdominal pain: Secondary | ICD-10-CM

## 2023-09-10 DIAGNOSIS — K5730 Diverticulosis of large intestine without perforation or abscess without bleeding: Secondary | ICD-10-CM

## 2023-09-11 ENCOUNTER — Encounter: Payer: Self-pay | Admitting: Family Medicine

## 2023-09-11 ENCOUNTER — Ambulatory Visit (INDEPENDENT_AMBULATORY_CARE_PROVIDER_SITE_OTHER): Payer: BC Managed Care – PPO | Admitting: Family Medicine

## 2023-09-11 VITALS — BP 110/70 | HR 87 | Temp 97.2°F | Ht 69.0 in | Wt 166.6 lb

## 2023-09-11 DIAGNOSIS — Z8349 Family history of other endocrine, nutritional and metabolic diseases: Secondary | ICD-10-CM | POA: Diagnosis not present

## 2023-09-11 DIAGNOSIS — E559 Vitamin D deficiency, unspecified: Secondary | ICD-10-CM

## 2023-09-11 DIAGNOSIS — E041 Nontoxic single thyroid nodule: Secondary | ICD-10-CM | POA: Diagnosis not present

## 2023-09-11 DIAGNOSIS — E7800 Pure hypercholesterolemia, unspecified: Secondary | ICD-10-CM | POA: Diagnosis not present

## 2023-09-11 DIAGNOSIS — E538 Deficiency of other specified B group vitamins: Secondary | ICD-10-CM | POA: Diagnosis not present

## 2023-09-11 DIAGNOSIS — Z0000 Encounter for general adult medical examination without abnormal findings: Secondary | ICD-10-CM

## 2023-09-11 DIAGNOSIS — R6889 Other general symptoms and signs: Secondary | ICD-10-CM

## 2023-09-11 LAB — CBC WITH DIFFERENTIAL/PLATELET
Basophils Absolute: 0 10*3/uL (ref 0.0–0.1)
Basophils Relative: 0.7 % (ref 0.0–3.0)
Eosinophils Absolute: 0.2 10*3/uL (ref 0.0–0.7)
Eosinophils Relative: 3.5 % (ref 0.0–5.0)
HCT: 45.3 % (ref 36.0–46.0)
Hemoglobin: 15.2 g/dL — ABNORMAL HIGH (ref 12.0–15.0)
Lymphocytes Relative: 33.5 % (ref 12.0–46.0)
Lymphs Abs: 1.7 10*3/uL (ref 0.7–4.0)
MCHC: 33.5 g/dL (ref 30.0–36.0)
MCV: 93.7 fL (ref 78.0–100.0)
Monocytes Absolute: 0.5 10*3/uL (ref 0.1–1.0)
Monocytes Relative: 9.1 % (ref 3.0–12.0)
Neutro Abs: 2.7 10*3/uL (ref 1.4–7.7)
Neutrophils Relative %: 53.2 % (ref 43.0–77.0)
Platelets: 379 10*3/uL (ref 150.0–400.0)
RBC: 4.84 Mil/uL (ref 3.87–5.11)
RDW: 13 % (ref 11.5–15.5)
WBC: 5.1 10*3/uL (ref 4.0–10.5)

## 2023-09-11 LAB — COMPREHENSIVE METABOLIC PANEL
ALT: 13 U/L (ref 0–35)
AST: 17 U/L (ref 0–37)
Albumin: 4.2 g/dL (ref 3.5–5.2)
Alkaline Phosphatase: 52 U/L (ref 39–117)
BUN: 15 mg/dL (ref 6–23)
CO2: 29 meq/L (ref 19–32)
Calcium: 9.3 mg/dL (ref 8.4–10.5)
Chloride: 103 meq/L (ref 96–112)
Creatinine, Ser: 1.01 mg/dL (ref 0.40–1.20)
GFR: 62.51 mL/min (ref >60.00)
Glucose, Bld: 90 mg/dL (ref 70–99)
Potassium: 4.3 meq/L (ref 3.5–5.1)
Sodium: 139 meq/L (ref 135–145)
Total Bilirubin: 0.9 mg/dL (ref 0.2–1.2)
Total Protein: 7.5 g/dL (ref 6.0–8.3)

## 2023-09-11 LAB — LIPID PANEL
Cholesterol: 259 mg/dL — ABNORMAL HIGH (ref 0–200)
HDL: 44.1 mg/dL (ref >39.00)
LDL Cholesterol: 174 mg/dL — ABNORMAL HIGH (ref 0–99)
NonHDL: 214.45
Total CHOL/HDL Ratio: 6
Triglycerides: 203 mg/dL — ABNORMAL HIGH (ref 0.0–149.0)
VLDL: 40.6 mg/dL — ABNORMAL HIGH (ref 0.0–40.0)

## 2023-09-11 LAB — VITAMIN B12: Vitamin B-12: 687 pg/mL (ref 211–911)

## 2023-09-11 LAB — VITAMIN D 25 HYDROXY (VIT D DEFICIENCY, FRACTURES): VITD: 33.51 ng/mL (ref 30.00–100.00)

## 2023-09-11 LAB — TSH: TSH: 0.86 u[IU]/mL (ref 0.35–5.50)

## 2023-09-11 MED ORDER — PSEUDOEPH-BROMPHEN-DM 30-2-10 MG/5ML PO SYRP
5.0000 mL | ORAL_SOLUTION | Freq: Four times a day (QID) | ORAL | 0 refills | Status: DC | PRN
Start: 2023-09-11 — End: 2023-12-31

## 2023-09-11 NOTE — Progress Notes (Signed)
Phone [phone]   Subjective:  Patient presents today for their annual physical. Chief complaint-noted.   See problem oriented charting- ROS- full  review of systems was completed and negative  Except for ongoing tinnitus, ongoing fatigue - history of long COVID then had flu and treated with tamiflu about 8 days ago- some lingering cough as well  The following were reviewed and entered/updated in epic: Past Medical History:  Diagnosis Date   Allergy    Coronary artery calcification    CT 07/2019: Ca score 3   Diverticulosis    hx of diverticulitis    Echocardiogram 12/2020   Echo 5/22: EF 60-65, no RWMA, GLS -21.0%, normal RVSF, mild MR, no pericardial effusion   GERD (gastroesophageal reflux disease)    History of chickenpox    Hyperlipidemia    Wears eyeglasses    dry eyes and cornea change    Patient Active Problem List   Diagnosis Date Noted   Bilateral occipital neuralgia 01/18/2021    Priority: High   Dry eye syndrome of both eyes 01/18/2021    Priority: High   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 01/18/2021    Priority: High   Postviral syndrome 10/13/2020    Priority: High   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 10/13/2020    Priority: High   S/P complete hysterectomy 05/17/2022    Priority: Medium    Tinnitus aurium, bilateral 01/18/2021    Priority: Medium    Chronic tension-type headache, intractable 10/13/2020    Priority: Medium    Occipital headache 10/13/2020    Priority: Medium    Fatty liver 09/19/2020    Priority: Medium    Pure hypercholesterolemia 07/07/2019    Priority: Medium    Postsurgical menopause 08/13/2009    Priority: Medium    Allergic rhinitis 11/03/2008    Priority: Medium    Photophobia of both eyes 01/18/2021    Priority: Low   Orthostatic hypertension 10/13/2020    Priority: Low   Dysphonia 10/13/2020    Priority: Low   Diverticular disease of colon 09/19/2020    Priority: Low    Cavernous hemangioma of liver 08/21/2019    Priority: Low   Thyroid nodule 08/21/2019    Priority: Low   Herpes simplex 07/07/2019    Priority: Low   GERD (gastroesophageal reflux disease) 07/07/2019    Priority: Low   Overactive bladder 07/07/2019    Priority: Low   Situational depression 11/03/2008    Priority: Low   Vitamin D deficiency 03/08/2023   Right cervical radiculopathy 08/22/2021   Cervico-occipital neuralgia of the right side 08/22/2021   Past Surgical History:  Procedure Laterality Date   ABDOMINAL HYSTERECTOMY     BLADDER SUSPENSION     CHOLECYSTECTOMY     LAPAROSCOPIC ENDOMETRIOSIS FULGURATION     3 separate surgeries    Family History  Problem Relation Age of Onset   Hyperlipidemia Mother    Hypertension Mother    Glaucoma Mother    Memory loss Mother        being evaluated for alzheimers   Diabetes Father    Hypertension Father    Cancer Father        Melanoma   Dementia Father    Lymphoma Father        MALT- was told not hereditary   Colon polyps Sister    Cancer Sister        Skin   Parkinson's disease Maternal Grandfather    Other Son  PANDAS    Medications- reviewed and updated Current Outpatient Medications  Medication Sig Dispense Refill   albuterol (VENTOLIN HFA) 108 (90 Base) MCG/ACT inhaler Inhale 1-2 puffs into the lungs every 6 (six) hours as needed. 8 g 0   dicyclomine (BENTYL) 20 MG tablet TAKE 1 TABLET (20 MG TOTAL) BY MOUTH 3 (THREE) TIMES DAILY AS NEEDED FOR SPASMS. AND DIARRHEA 90 tablet 1   estradiol (ESTRACE) 1 MG tablet Take 1 mg by mouth daily.     loratadine (CLARITIN) 10 MG tablet Take 10 mg by mouth daily as needed.     valACYclovir (VALTREX) 1000 MG tablet Take two tablets ( total 2000 mg) by mouth q12h x 1 day; Start: ASAP after symptom onset 30 tablet 0   brompheniramine-pseudoephedrine-DM 30-2-10 MG/5ML syrup Take 5 mLs by mouth 4 (four) times daily as needed. (Patient not taking: Reported on 09/11/2023) 120 mL  0   No current facility-administered medications for this visit.    Allergies-reviewed and updated Allergies  Allergen Reactions   Ciprofloxacin Hives and Nausea And Vomiting   Codeine    Doxycycline Itching   Iodine    Penicillins     REACTION: hives   Sulfonamide Derivatives     REACTION: rash    Social History   Social History Narrative   Lives at home with spouse. [name] 21 and Maisie Fus 17  at home in 2022.       Disabled 2022 from Long covid- had been doing temp prior to long covid      Hobbies: enjoys watching thomas play basketball, beach, time with friends, shopping      Right handed   Caffeine: no   Objective  Objective:  BP 110/70   Pulse 87   Temp (!) 97.2 F (36.2 C)   Ht 5\' 9"  (1.753 m)   Wt 166 lb 9.6 oz (75.6 kg)   SpO2 98%   BMI 24.60 kg/m  Gen: NAD, resting comfortably HEENT: Mucous membranes are moist. Oropharynx normal Neck: no thyromegaly CV: RRR no murmurs rubs or gallops Lungs: CTAB no crackles, wheeze, rhonchi Abdomen: soft/nontender/nondistended/normal bowel sounds. No rebound or guarding.  Ext: no edema Skin: warm, dry Neuro: grossly normal, moves all extremities, PERRLA   Assessment and Plan   58 y.o. female presenting for annual physical.  Health Maintenance counseling: 1. Anticipatory guidance: Patient counseled regarding regular dental exams -q6 months, eye exams - dry eyes more than once a year eye vsiits,  avoiding smoking and second hand smoke , limiting alcohol to 1 beverage per day- less than one a month , no illicit drugs  2. Risk factor reduction:  Advised patient of need for regular exercise and diet rich and fruits and vegetables to reduce risk of heart attack and stroke.  Exercise- had been doing pilates - pause over holidays and planning to restart. Some limited walking.  Diet/weight management-up 5 lbs in last year but still BMI in normal range. Encouraged maintain to mild weight los Wt Readings from Last 3 Encounters:   09/11/23 166 lb 9.6 oz (75.6 kg)  06/05/23 165 lb 4 oz (75 kg)  03/08/23 162 lb (73.5 kg)  3. Immunizations/screenings/ancillary studies- Tetanus, Diphtheria, and Pertussis (Tdap) - opts out after recent flu- can reach out or get at pharmacy- same with, shingrix . Holding off on flu shot and covid Immunization History  Administered Date(s) Administered   Influenza Split 07/19/2007, 08/01/2009, 07/25/2011, 06/05/2012   Influenza,inj,quad, With Preservative 08/09/2014, 08/10/2015   Influenza-Unspecified 05/13/2014, 05/16/2015  PFIZER(Purple Top)SARS-COV-2 Vaccination 11/05/2019, 11/26/2019  4. Cervical cancer screening- s/p total hysterectomy for benign reasons 5. Breast cancer screening-  breast exam with GYN and mammogram 05/22/23 6. Colon cancer screening - Colonoscopy 2024 with Dr. Loreta Ave with 5-year repeat  due to tubular adenoma 7. Skin cancer screening- saw Dr. [place] gso derm yearly. advised regular sunscreen use. Denies worrisome, changing, or new skin lesions.  8. Birth control/STD check- postmenopausal and monogamous/plus hysterectomy  9. Osteoporosis screening at 33- normal bone density 2022 -Never smoker  Status of chronic or acute concerns   #reduced libido- doing testosterone injections with Dr. Aldona Bar- has helped some but not sure she loves injections  #chronic intermittent urticaria- unclear cause at 03/08/23  and recommend patch testing with dermatology vs allergist. Labs reassuring including cortisol.  -had a few more issues after last visit but then resolved  #Thyroid nodule- From 04/20/2023-1. Query nodule labeled 1, possible 2.0 cm TR 3 nodule versus pseudo nodule. Follow-up ultrasound in 1 year is recommended. #family history hashimotos thyroiditis - wants tpo antibodies plus we will check tsh  #hyperlipidemia with LDL over 200 at times-Dr. Eden Emms 06/27/2022-Calcium score only 3 isolated to Lad but high for age as most people age 59 have scores of 0 S: Medication:not on  medication- prefers not to take statin Lab Results  Component Value Date   CHOL 323 (H) 03/08/2023   HDL 52.60 03/08/2023   LDLCALC 179 04/02/2022   LDLDIRECT 207.0 03/08/2023   TRIG 364.0 (H) 03/08/2023   CHOLHDL 6 03/08/2023   A/P: we discussed per guidelines would recommend statin- she declines at this time- aware of more long term risk for heart attack and stroke- she wants to work on whole foods plant based diet and we discussed likely would take vegan diet for substantial improvement. Also encouraged cardiology follow up for their perspective  #Vitamin D deficiency S: Medication:  none  A/P: thankfully last visit ok- wants to check today   # B12 deficiency S: Current treatment/medication (oral vs. IM): B12 in vitamin that she takes but not sure of dose  Lab Results  Component Value Date   VITAMINB12 397 03/08/2023  A/P: relative deficiency- update B12- prefer over 400   Recommended follow up: Return in about 1 year (around 09/10/2024) for physical or sooner if needed.Schedule b4 you leave.  Lab/Order associations: fasting   ICD-10-CM   1. Preventative health care  Z00.00     2. Pure hypercholesterolemia  E78.00 Comprehensive metabolic panel    CBC with Differential/Platelet    Lipid panel    3. Vitamin D deficiency  E55.9 VITAMIN D 25 Hydroxy (Vit-D Deficiency, Fractures)    4. B12 deficiency  E53.8 Vitamin B12    5. Family history of Hashimoto thyroiditis  Z83.49 TSH    Anti-TPO Ab (RDL)    6. Thyroid nodule  E04.1 TSH     No orders of the defined types were placed in this encounter.  Return precautions advised.  Tana Conch, MD

## 2023-09-11 NOTE — Patient Instructions (Addendum)
Please stop by lab before you go If you have mychart- we will send your results within 3 business days of [place] receiving them.  If you do not have mychart- we will call you about results within 5 business days of [place] receiving them.  *please also note that you will see labs on mychart as soon as they post. I will later go in and write notes on them- will say "notes from Dr. Durene Cal"   Due for Tetanus, Diphtheria, and Pertussis (Tdap) if get cut/scrape/bad sting  Schedule cardiology follow up - I would recommend statin but you wanted to hold off for now- consider vegan diet  Recommended follow up: Return in about 1 year (around 09/10/2024) for physical or sooner if needed.Schedule b4 you leave.

## 2023-09-12 ENCOUNTER — Encounter: Payer: Self-pay | Admitting: Family Medicine

## 2023-09-21 LAB — ANTI-TPO AB (RDL): Anti-TPO Ab (RDL): <9 [IU]/mL (ref <9.0)

## 2023-09-23 ENCOUNTER — Encounter: Payer: Self-pay | Admitting: Family Medicine

## 2023-10-03 ENCOUNTER — Other Ambulatory Visit: Payer: Self-pay | Admitting: Family Medicine

## 2023-10-03 DIAGNOSIS — R1084 Generalized abdominal pain: Secondary | ICD-10-CM

## 2023-10-15 ENCOUNTER — Encounter: Payer: Self-pay | Admitting: Family Medicine

## 2023-11-08 ENCOUNTER — Encounter: Payer: Self-pay | Admitting: Family Medicine

## 2023-11-13 ENCOUNTER — Encounter: Payer: Self-pay | Admitting: Family Medicine

## 2023-11-13 ENCOUNTER — Ambulatory Visit (INDEPENDENT_AMBULATORY_CARE_PROVIDER_SITE_OTHER): Admitting: Family Medicine

## 2023-11-13 VITALS — BP 118/79 | HR 70 | Temp 97.8°F | Ht 69.0 in | Wt 168.8 lb

## 2023-11-13 DIAGNOSIS — M79662 Pain in left lower leg: Secondary | ICD-10-CM | POA: Diagnosis not present

## 2023-11-13 DIAGNOSIS — M79661 Pain in right lower leg: Secondary | ICD-10-CM | POA: Diagnosis not present

## 2023-11-13 DIAGNOSIS — I739 Peripheral vascular disease, unspecified: Secondary | ICD-10-CM

## 2023-11-13 NOTE — Patient Instructions (Addendum)
 Call Phone: [phone] if you don't hear within a few days about ultrasound to rule out clot and arterial blood flow test (2 different tests) both ordered at [facility] location  Listen to your body and lets limit walking to 1 mile for next week and then try to build up - hoping we can have testing back in that time frame  Recommended follow up: Return for as needed for new, worsening, persistent symptoms. Or if you stop walking and pain doesn't resolve or have rest pain please let us know -seek care immediately if shortness of breath or chest pain

## 2023-11-13 NOTE — Progress Notes (Signed)
 Phone [phone] In person visit   Subjective:   [name] is a 58 y.o. 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 10/13/2020    Priority: High   S/P complete hysterectomy 05/17/2022    Priority: Medium    Tinnitus aurium, bilateral 01/18/2021    Priority: Medium    Chronic tension-type headache, intractable 10/13/2020    Priority: Medium    Occipital headache 10/13/2020    Priority: Medium    Fatty liver 09/19/2020    Priority: Medium    Pure hypercholesterolemia 07/07/2019    Priority: Medium    Postsurgical menopause 08/13/2009    Priority: Medium    Allergic rhinitis 11/03/2008    Priority: Medium    Photophobia of both eyes 01/18/2021    Priority: Low   Orthostatic hypertension 10/13/2020    Priority: Low   Dysphonia 10/13/2020    Priority: Low   Diverticular disease of colon 09/19/2020    Priority: Low   Cavernous hemangioma of liver 08/21/2019    Priority: Low   Thyroid nodule 08/21/2019    Priority: Low   Herpes simplex 07/07/2019    Priority: Low   GERD (gastroesophageal reflux disease) 07/07/2019    Priority: Low   Overactive bladder 07/07/2019    Priority: Low   Situational depression 11/03/2008    Priority: Low   Vitamin D deficiency 03/08/2023   Right cervical radiculopathy 08/22/2021   Cervico-occipital neuralgia of the right side 08/22/2021    Medications- reviewed and updated Current  Outpatient Medications  Medication Sig Dispense Refill   albuterol (VENTOLIN HFA) 108 (90 Base) MCG/ACT inhaler Inhale 1-2 puffs into the lungs every 6 (six) hours as needed. 8 g 0   dicyclomine (BENTYL) 20 MG tablet TAKE 1 TABLET (20 MG TOTAL) BY MOUTH 3 (THREE) TIMES DAILY AS NEEDED FOR SPASMS AND DIARRHEA 270 tablet 1   estradiol (ESTRACE) 1 MG tablet Take 1 mg by mouth daily.     fluticasone (FLONASE ALLERGY RELIEF) 50 MCG/ACT nasal spray      loratadine (CLARITIN) 10 MG tablet Take 10 mg by mouth daily as needed.     valACYclovir (VALTREX) 1000 MG tablet Take two tablets ( total 2000 mg) by mouth q12h x 1 day; Start: ASAP after symptom onset 30 tablet 0   brompheniramine-pseudoephedrine-DM 30-2-10 MG/5ML syrup Take 5 mLs by mouth 4 (four) times daily as needed. (Patient not taking: Reported on 11/13/2023) 120 mL 0   No current facility-administered medications for this visit.     Objective:  BP 118/79   Pulse 70   Temp 97.8 F (36.6 C)   Ht 5\' 9"  (1.753 m)   Wt 168 lb 12.8 oz (76.6 kg)   SpO2 97%   BMI 24.93 kg/m  Gen: NAD, resting comfortably CV: RRR no murmurs rubs or gallops Lungs: CTAB no crackles, wheeze, rhonchi Ext: no edema Skin: warm, dry, no rash- some varisose  veins on loewr legs Neuro: grossly normal, moves all extremities- normal sensation in feet to gross touch Ext: does have tenderness in bilateral calves. No family or personal history of DVT reporte.D  2+ DP and PT pulses    Assessment and Plan   # Calf pain/ foot numbness with walking S:1 week of issues . Started back walking about a week ago with a friend. When she gets about a mile in gets calf pain in both legs and sometimes seems to start lower in leg and work its way up. Then foot starts gets prickly feeling like going to sleep. Takes off her shoes and massages and can continue walking. Can massage calves and gets better. Has tried stretching but doesn't make a difference. Foot prickling issue more on the  right but gets some on left. Taking shoe off helps some but then tried walking like that and issues recurred similar  Symptoms start at around a mile - whether flat or uphill doesn't matter. Has tried several times- walked at least 4 x in last week  Went to [employer] to check her on cloud shoes and they stated more casual shoes and got a new pair. New shoe more appropriate fit but hasn't tried yet- fitted at [employer]  Prior back pain that went into lateral thigh- has had some right lateral hip pain feels better with stretching out - trying to walk for cardiovascular health and has improved diet  -staying well hydrated- using powerade zero in her water bottle even with the walk but didn't help A/P:  Patient with recurrent calf pain with walking approximately a mile and also develops tingling in her feet.  No current back pain issues but does have history of back pain that radiated in the lateral hip and is having some lateral right hip pain.  She has bilateral calf tenderness as well - Of most importance is ruling out reduced arterial flow-ordered ABIs even with reassuring DP and PT pulses.  High risk with high cholesterol currently untreated and elevated for age CT calcium scoring-pending cardiology consult - With calf pain on exam despite not walking for 2 days I also rule out DVT although I think this is less likely and ordered venous duplex - Try to get both studies done at the same place - labs within 3 months included reassuring B12 and TSH so we will hold off on further neuropathy labs-CBC and CMP largely reassuring at that time - If these tests are reassuring considering focusing on potential musculoskeletal concerns-possible sports medicine referral or orthopedics  Recommended follow up: Return for as needed for new, worsening, persistent symptoms. Future Appointments  Date Time Provider Department Center  12/31/2023  3:10 PM Kennon Rounds [facility]  09/11/2024   9:00 AM Durene Cal Aldine Contes, MD [facility]    Lab/Order associations:   ICD-10-CM   1. Intermittent claudication (HCC)  I73.9 VAS [place] ABI WITH/WO TBI    2. Bilateral calf pain  M79.661 VAS [place] LOWER EXTREMITY VENOUS (DVT)   M79.662       Time Spent: 31 minutes of total time (9:47 AM- 10:18 AM) was spent on the date of the encounter performing the following actions: obtaining history, performing a medically necessary exam, counseling on the workup plan-focusing on high risk conditions in particular and overlap with known coronary artery calcium elevation as well as giving emergent precautions, placing orders, and documenting in our EHR.    Return precautions advised.  Tana Conch, MD

## 2023-11-14 ENCOUNTER — Ambulatory Visit (INDEPENDENT_AMBULATORY_CARE_PROVIDER_SITE_OTHER)
Admission: RE | Admit: 2023-11-14 | Discharge: 2023-11-14 | Disposition: A | Discharge status: Home / Self Care | Source: Ambulatory Visit | Attending: Cardiovascular Disease | Admitting: Cardiovascular Disease

## 2023-11-14 ENCOUNTER — Ambulatory Visit (HOSPITAL_COMMUNITY)
Admission: RE | Admit: 2023-11-14 | Discharge: 2023-11-14 | Disposition: A | Discharge status: Home / Self Care | Source: Ambulatory Visit | Attending: Cardiovascular Disease | Admitting: Cardiovascular Disease

## 2023-11-14 DIAGNOSIS — M79661 Pain in right lower leg: Secondary | ICD-10-CM | POA: Insufficient documentation

## 2023-11-14 DIAGNOSIS — I739 Peripheral vascular disease, unspecified: Secondary | ICD-10-CM | POA: Diagnosis not present

## 2023-11-14 DIAGNOSIS — M79662 Pain in left lower leg: Secondary | ICD-10-CM | POA: Insufficient documentation

## 2023-11-15 ENCOUNTER — Encounter: Payer: Self-pay | Admitting: Family Medicine

## 2023-12-31 ENCOUNTER — Encounter: Payer: Self-pay | Admitting: Physician Assistant

## 2023-12-31 ENCOUNTER — Ambulatory Visit: Attending: Physician Assistant | Admitting: Physician Assistant

## 2023-12-31 VITALS — BP 102/60 | HR 80 | Ht 69.0 in | Wt 173.4 lb

## 2023-12-31 DIAGNOSIS — I2510 Atherosclerotic heart disease of native coronary artery without angina pectoris: Secondary | ICD-10-CM | POA: Diagnosis not present

## 2023-12-31 DIAGNOSIS — M79605 Pain in left leg: Secondary | ICD-10-CM | POA: Diagnosis not present

## 2023-12-31 DIAGNOSIS — E782 Mixed hyperlipidemia: Secondary | ICD-10-CM | POA: Diagnosis not present

## 2023-12-31 DIAGNOSIS — M79604 Pain in right leg: Secondary | ICD-10-CM | POA: Diagnosis not present

## 2023-12-31 NOTE — Patient Instructions (Signed)
 Medication Instructions:  The medications we talked about that help reduce cholesterol and are not statins. Ezetimibe (Zetia) Evolocumab (Repatha) Alirocumab (Praluent)  *If you need a refill on your cardiac medications before your next appointment, please call your pharmacy*  Follow-Up: At [facility], you and your health needs are our priority.  As part of our continuing mission to provide you with exceptional heart care, our providers are all part of one team.  This team includes your primary Cardiologist (physician) and Advanced Practice Providers or APPs (Physician Assistants and Nurse Practitioners) who all work together to provide you with the care you need, when you need it.  Your next appointment:   12 month(s)  Provider:   Janelle Mediate, MD or Marlyse Single, PA-C          We recommend signing up for the patient portal called "MyChart".  Sign up information is provided on this After Visit Summary.  MyChart is used to connect with patients for Virtual Visits (Telemedicine).  Patients are able to view lab/test results, encounter notes, upcoming appointments, etc.  Non-urgent messages can be sent to your provider as well.   To learn more about what you can do with MyChart, go to [email or web address].   Other Instructions

## 2023-12-31 NOTE — Progress Notes (Signed)
 Cardiology Office Note:    Date:  12/31/2023  ID:  [name], DOB 19-Nov-1965, MRN [id number] PCP: Almira Jaeger, MD  [facility] Providers Cardiologist:  Janelle Mediate, MD       Patient Profile:      Coronary calcification (CT 07/2019) CAC score 07/17/2019: 3 (84th percentile) CAC score 04/26/2022: 3.08 (79th percentile) Monitor 11/2020: No significant arrhythmias TTE 12/19/2020: EF 60-65, no RWMA, normal RVSF, mild MR Hyperlipidemia GERD Diverticulosis  Diverticulitis in 09/2018 ABIs 11/15/2023: Normal            Discussed the use of AI scribe software for clinical note transcription with the patient, who gave verbal consent to proceed.  History of Present Illness [name] is a 58 y.o. female who returns for follow up of CAC, HL. She was last seen by Dr. Stann Earnest in 06/2022. Recent LDL done with primary care 174.   She experiences leg pain characterized by cramping and tingling in her feet, occurring after walking about a mile and worsening at two miles, primarily below the knee. Changing shoes has not provided relief. She had ABIs which were normal. 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 She has previously taken Crestor , which improved her cholesterol levels, but she is currently not on any cholesterol-lowering medication. Her family history is significant for heart disease, diabetes, high blood pressure, and cancer. She has experienced long COVID symptoms, including brain fog, which improved significantly after taking supplements. However, some symptoms returned after a recent flu-like illness. She has not had chest pain, pressure, or shortness of breath.   ROS-See HPI       Studies Reviewed:   EKG Interpretation Date/Time:  Tuesday Dec 31 2023 15:09:24 EDT Ventricular Rate:  80 PR Interval:  142 QRS Duration:  84 QT Interval:  362 QTC Calculation: 417 R Axis:   72  Text  Interpretation: Normal sinus rhythm Normal ECG Confirmed by Marlyse Single [id number]) on 12/31/2023 3:28:52 PM    Results Recent Labs    03/08/23 1102 09/11/23 0945  CHOL 323* 259*  TRIG 364.0* 203.0*  HDL 52.60 44.10  LDLCALC  --  174*   Recent Labs    03/08/23 1102  LDLDIRECT 207.0     Risk Assessment/Calculations:             Physical Exam:   VS:  BP 102/60   Pulse 80   Ht 5\' 9"  (1.753 m)   Wt 173 lb 6.4 oz (78.7 kg)   SpO2 98%   BMI 25.61 kg/m    Wt Readings from Last 3 Encounters:  12/31/23 173 lb 6.4 oz (78.7 kg)  11/13/23 168 lb 12.8 oz (76.6 kg)  09/11/23 166 lb 9.6 oz (75.6 kg)    Constitutional:      Appearance: Healthy appearance. Not in distress.  Neck:     Vascular: No carotid bruit. JVD normal.  Pulmonary:     Breath sounds: Normal breath sounds. No wheezing. No rales.  Cardiovascular:     Normal rate. Regular rhythm.     Murmurs: There is no murmur.     Comments: DP/PT 2+ bilat Edema:    Peripheral edema absent.  Abdominal:     Palpations: Abdomen is soft.       Assessment and Plan:   Assessment & Plan Coronary artery calcification seen on CT scan CAC score in 04/2022 was 3 placing her in the 79th percentile. She  is not having anginal symptoms. We discussed the importance of getting her LDL down to reduce CV risk. I have asked her to consider Ezetimibe vs PCSK9 inhibitor. Mixed hyperlipidemia LDL cholesterol has been elevated. It was 207 mg/dL in July 2024 and 161 mg/dL in January 0960. With an LDL over 200, question if she has Familial Hyperlipidemia. She is hesitant to take statins due to concerns about side effects and long COVID. Discussed alternative options including Zetia and PCSK9 inhibitors (Repatha, Praluent). With her CAC score, I would try to get her LDL to at least < 100.  - I have provided information on Zetia and PCSK9 inhibitors (Repatha, Praluent) for her to review. - She will contact us  if she would like to try one - Offer  referral to lipid clinic for further discussion if desired. Pain in both lower extremities She has exertional calf pain and numbness in her feet. She has good distal pulses. ABIs were normal. Would consider neuro eval to further evaluate. Follow up with primary care to discuss.        Dispo:  Return in about 1 year (around 12/30/2024) for Routine Follow Up, w/ Dr. Stann Earnest, or Marlyse Single, PA-C.  Signed, Marlyse Single, PA-C

## 2024-01-07 ENCOUNTER — Encounter: Payer: Self-pay | Admitting: Family Medicine

## 2024-02-04 ENCOUNTER — Encounter: Payer: Self-pay | Admitting: Neurology

## 2024-03-24 ENCOUNTER — Encounter: Payer: Self-pay | Admitting: Neurology

## 2024-03-24 ENCOUNTER — Ambulatory Visit (INDEPENDENT_AMBULATORY_CARE_PROVIDER_SITE_OTHER): Admitting: Neurology

## 2024-03-24 VITALS — BP 118/75 | HR 78 | Ht 69.0 in | Wt 177.0 lb

## 2024-03-24 DIAGNOSIS — R202 Paresthesia of skin: Secondary | ICD-10-CM | POA: Diagnosis not present

## 2024-03-24 DIAGNOSIS — R200 Anesthesia of skin: Secondary | ICD-10-CM | POA: Diagnosis not present

## 2024-03-24 NOTE — Progress Notes (Signed)
 [facility] Clinic Note - Initial Visit   Date: 03/24/2024   Laressa Bolinger MRN: [id number] DOB: 1965-10-01   Dear Dr. Katrinka:  Thank you for your kind referral of Alysia Scism for consultation of bilateral leg pain. Although her history is well known to you, please allow us  to reiterate it for the purpose of our medical record. The patient was accompanied to the clinic by self.    Kaela Felicidad Sugarman is a 58 y.o. right-handed female presenting for evaluation of bilateral leg pain.   IMPRESSION/PLAN: Bilateral feet and left lower extremity paresthesias.  Unclear etiology as her exam does not have any findings to suggest neuropathy or radiculopathy.  Distal sensation, strength, and reflexes are normal.   She will have NCS/EMG bilateral legs to better characterize her symptoms.  I will request MRI lumbar spine report.  Right hip and knee pain seems more musculoskeletal.  Pain does not have neuropathic features and symptoms do not fit a nerve or dermatomal distribution.  Further recommendations pending results.   ------------------------------------------------------------- History of present illness: Starting around April 2025, she began having achy/sharp pain over right hip which radiates a few inches towards her groin and towards her buttocks.  She also have right knee pain (front and back).  Additionally, she has bilateral calf pain and cramps with walking.  She has numbness/tingling in the feet and electrical sensation down the anterior calf.  It is worse with walking, but can be present at rest.   Rest improves her symptoms.  Activity aggravates right hip and knee pain.  She has seen Dr. Arnaldo at [facility] for these symptoms and had MRI lumbar spine (results not available).   Out-side paper records, electronic medical record, and images have been reviewed where available and summarized as:  MRI brain wwo contrast 10/27/2020:   Normal  Lab Results  Component Value Date   HGBA1C 5.5 04/02/2022   Lab Results  Component Value Date   VITAMINB12 687 09/11/2023   Lab Results  Component Value Date   TSH 0.86 09/11/2023   Lab Results  Component Value Date   ESRSEDRATE 13 08/28/2022    Past Medical History:  Diagnosis Date   Allergy    Coronary artery calcification    CT 07/2019: Ca score 3   Diverticulosis    hx of diverticulitis    Echocardiogram 12/2020   Echo 5/22: EF 60-65, no RWMA, GLS -21.0%, normal RVSF, mild MR, no pericardial effusion   GERD (gastroesophageal reflux disease)    History of chickenpox    Hyperlipidemia    Wears eyeglasses    dry eyes and cornea change     Past Surgical History:  Procedure Laterality Date   ABDOMINAL HYSTERECTOMY     BLADDER SUSPENSION     CHOLECYSTECTOMY     LAPAROSCOPIC ENDOMETRIOSIS FULGURATION     3 separate surgeries     Medications:  Outpatient Encounter Medications as of 03/24/2024  Medication Sig   albuterol  (VENTOLIN  HFA) 108 (90 Base) MCG/ACT inhaler Inhale 1-2 puffs into the lungs every 6 (six) hours as needed.   dicyclomine  (BENTYL ) 20 MG tablet TAKE 1 TABLET (20 MG TOTAL) BY MOUTH 3 (THREE) TIMES DAILY AS NEEDED FOR SPASMS AND DIARRHEA   estradiol (ESTRACE) 1 MG tablet Take 1 mg by mouth daily.   fluticasone (FLONASE ALLERGY RELIEF) 50 MCG/ACT nasal spray Place 1 spray into both nostrils as needed for allergies or rhinitis.   loratadine (CLARITIN) 10 MG tablet Take  10 mg by mouth daily as needed for allergies, rhinitis or itching.   Omega 3 1000 MG CAPS Take 1,000 mg by mouth daily.   valACYclovir  (VALTREX ) 1000 MG tablet Take 1,000 mg by mouth as needed (for breakouts).   No facility-administered encounter medications on file as of 03/24/2024.    Allergies:  Allergies  Allergen Reactions   Codeine Nausea And Vomiting   Iodine    Penicillins     REACTION: hives   Sulfonamide Derivatives     REACTION: rash   Ciprofloxacin Hives,  Nausea And Vomiting, Dermatitis and Nausea Only   Doxycycline Itching and Rash    Family History: Family History  Problem Relation Age of Onset   Hyperlipidemia Mother    Hypertension Mother    Glaucoma Mother    Memory loss Mother        being evaluated for alzheimers   Diabetes Father    Hypertension Father    Cancer Father        Melanoma   Dementia Father    Lymphoma Father        MALT- was told not hereditary   Colon polyps Sister    Cancer Sister        Skin   Parkinson's disease Maternal Grandfather    Other Son        PANDAS    Social History: Social History   Tobacco Use   Smoking status: Never   Smokeless tobacco: Never  Vaping Use   Vaping status: Never Used  Substance Use Topics   Alcohol use: Yes    Comment: social- less than 1 a week   Drug use: Never   Social History   Social History Narrative   Lives at home with spouse. [name] 21 and Debby 17  at home in 2022.       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 Right Handed   Are you currently employed ? 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 No    Who lives with you? Husband    What type of home do you live in: 1 story or 2 story? #3 level home. Patient rarely goes into the basement.         Vital Signs:  BP 118/75   Pulse 78   Ht 5' 9 (1.753 m)   Wt 177 lb (80.3 kg)   SpO2 98%   BMI 26.14 kg/m   Neurological Exam: MENTAL STATUS including orientation to time, place, person, recent and remote memory, attention span and concentration, language, and fund of knowledge is normal.  Speech is not dysarthric.  CRANIAL NERVES: II:  No visual field defects.     III-IV-VI: Pupils equal round and reactive to light.  Normal conjugate, extra-ocular eye  movements in all directions of gaze.  No nystagmus.  No ptosis.   V:  Normal facial sensation.    VII:  Normal facial symmetry and movements.   VIII:  Normal hearing and vestibular function.   IX-X:  Normal palatal movement.   XI:  Normal shoulder shrug and head rotation.   XII:  Normal tongue strength and range of  motion, no deviation or fasciculation.  MOTOR:  No atrophy, fasciculations or abnormal movements.  No pronator drift.   Upper Extremity:  Right  Left  Deltoid  5/5   5/5   Biceps  5/5   5/5   Triceps  5/5   5/5   Wrist extensors  5/5   5/5   Wrist flexors  5/5   5/5   Finger extensors  5/5   5/5   Finger flexors  5/5   5/5   Dorsal interossei  5/5   5/5   Abductor pollicis  5/5   5/5   Tone (Ashworth scale)  0  0   Lower Extremity:  Right  Left  Hip flexors  5/5   5/5   Knee flexors  5/5   5/5   Knee extensors  5/5   5/5   Dorsiflexors  5/5   5/5   Plantarflexors  5/5   5/5   Toe extensors  5/5   5/5   Toe flexors  5/5   5/5   Tone (Ashworth scale)  0  0   MSRs:                                           Right        Left brachioradialis 2+  2+  biceps 2+  2+  triceps 2+  2+  patellar 2+  2+  ankle jerk 2+  2+  Hoffman no  no  plantar response down  down   SENSORY:  Normal and symmetric perception of light touch, pinprick, vibration, and temperature.  Romberg's sign absent.   COORDINATION/GAIT: Normal finger-to- nose-finger.  Intact rapid alternating movements bilaterally.  Able to rise from a chair without using arms.  Gait narrow based and stable. Tandem and stressed gait intact.     Thank you for allowing me to participate in patient's care.  If I can answer any additional questions, I would be pleased to do so.    Sincerely,    Leticia Coletta K. Tobie, DO

## 2024-03-24 NOTE — Patient Instructions (Signed)
Nerve testing of the legs  ELECTROMYOGRAM AND NERVE CONDUCTION STUDIES (EMG/NCS) INSTRUCTIONS  How to Prepare The neurologist conducting the EMG will need to know if you have certain medical conditions. Tell the neurologist and other EMG lab personnel if you: Have a pacemaker or any other electrical medical device Take blood-thinning medications Have hemophilia, a blood-clotting disorder that causes prolonged bleeding Bathing Take a shower or bath shortly before your exam in order to remove oils from your skin. Don't apply lotions or creams before the exam.  What to Expect You'll likely be asked to change into a hospital gown for the procedure and lie down on an examination table. The following explanations can help you understand what will happen during the exam.  Electrodes. The neurologist or a technician places surface electrodes at various locations on your skin depending on where you're experiencing symptoms. Or the neurologist may insert needle electrodes at different sites depending on your symptoms.  Sensations. The electrodes will at times transmit a tiny electrical current that you may feel as a twinge or spasm. The needle electrode may cause discomfort or pain that usually ends shortly after the needle is removed. If you are concerned about discomfort or pain, you may want to talk to the neurologist about taking a short break during the exam.  Instructions. During the needle EMG, the neurologist will assess whether there is any spontaneous electrical activity when the muscle is at rest - activity that isn't present in healthy muscle tissue - and the degree of activity when you slightly contract the muscle.  He or she will give you instructions on resting and contracting a muscle at appropriate times. Depending on what muscles and nerves the neurologist is examining, he or she may ask you to change positions during the exam.  After your EMG You may experience some temporary, minor  bruising where the needle electrode was inserted into your muscle. This bruising should fade within several days. If it persists, contact your primary care doctor.   

## 2024-05-21 ENCOUNTER — Ambulatory Visit (INDEPENDENT_AMBULATORY_CARE_PROVIDER_SITE_OTHER): Admitting: Neurology

## 2024-05-21 DIAGNOSIS — R200 Anesthesia of skin: Secondary | ICD-10-CM

## 2024-05-21 DIAGNOSIS — R202 Paresthesia of skin: Secondary | ICD-10-CM

## 2024-05-21 NOTE — Procedures (Signed)
 [facility]  [address], [address]  [place], [place] [address] Tel: [phone] Fax: [phone] Test Date:  05/21/2024  Patient: [name] DOB: July 15, 1966 Physician: Tonita Blanch, DO  Sex: Female Height: 5' 9 Ref Phys: Tonita Blanch, DO  ID#: [id number]   Technician:    History: This is a 58 year old female referred for evaluation of bilateral feet pain and left leg paresthesias.  NCV & EMG Findings: Extensive electrodiagnostic testing of the right lower extremity and additional studies of the left shows:  Bilateral sural and superficial peroneal sensory responses are within normal limits. Bilateral peroneal and tibial motor responses are within normal limits. Bilateral tibial H reflex studies are within normal limits. There is no evidence of active or chronic motor axonal loss changes affecting any of the tested muscles.  Motor unit configuration and recruitment pattern is within normal limits.  Impression: This is a normal study of bilateral lower extremities.  In particular, there is no evidence of a large fiber sensorimotor polyneuropathy, lumbosacral radiculopathy, or diffuse myopathy.   ___________________________ Tonita Blanch, DO    Nerve Conduction Studies   Stim Site NR Peak (ms) Norm Peak (ms) O-P Amp (V) Norm O-P Amp  Left Sup Peroneal Anti Sensory (Ant Lat Mall)  32 C  12 cm    2.8 <4.6 6.2 >4  Right Sup Peroneal Anti Sensory (Ant Lat Mall)  32 C  12 cm    2.2 <4.6 7.2 >4  Left Sural Anti Sensory (Lat Mall)  32 C  Calf    2.9 <4.6 12.5 >4  Right Sural Anti Sensory (Lat Mall)  32 C  Calf    2.8 <4.6 12.5 >4     Stim Site NR Onset (ms) Norm Onset (ms) O-P Amp (mV) Norm O-P Amp Site1 Site2 Delta-0 (ms) Dist (cm) Vel (m/s) Norm Vel (m/s)  Left Peroneal Motor (Ext Dig Brev)  32 C  Ankle    5.5 <6.0 2.6 >2.5 B Fib Ankle 9.3 37.0 40 >40  B Fib    14.8  1.7  Poplt B Fib 1.8 8.0 44 >40  Poplt    16.6  1.7         Right Peroneal Motor  (Ext Dig Brev)  32 C  Ankle    5.5 <6.0 2.5 >2.5 B Fib Ankle 9.3 37.0 40 >40  B Fib    14.8  1.6  Poplt B Fib 1.8 8.0 44 >40  Poplt    16.6  1.6         Left Peroneal TA Motor (Tib Ant)  32 C  Fib Head    3.6 <4.5 7.2 >3 Poplit Fib Head 1.1 7.0 64 >40  Poplit    4.7 <5.7 7.1         Right Peroneal TA Motor (Tib Ant)  32 C  Fib Head    3.0 <4.5 8.3 >3 Poplit Fib Head 1.3 8.0 62 >40  Poplit    4.3 <5.7 8.0         Left Tibial Motor (Abd Hall Brev)  32 C  Ankle    4.1 <6.0 12.1 >4 Knee Ankle 9.1 41.0 45 >40  Knee    13.2  8.8         Right Tibial Motor (Abd Hall Brev)  32 C  Ankle    4.4 <6.0 14.4 >4 Knee Ankle 10.1 44.0 44 >40  Knee    14.5  8.5  Electromyography   Side Muscle Ins.Act Fibs Fasc Recrt Amp Dur Poly Activation Comment  Right AntTibialis Nml Nml Nml Nml Nml Nml Nml Nml N/A  Right Gastroc Nml Nml Nml Nml Nml Nml Nml Nml N/A  Right Flex Dig Long Nml Nml Nml Nml Nml Nml Nml Nml N/A  Right RectFemoris Nml Nml Nml Nml Nml Nml Nml Nml N/A  Right GluteusMed Nml Nml Nml Nml Nml Nml Nml Nml N/A  Left AntTibialis Nml Nml Nml Nml Nml Nml Nml Nml N/A  Left Gastroc Nml Nml Nml Nml Nml Nml Nml Nml N/A  Left Flex Dig Long Nml Nml Nml Nml Nml Nml Nml Nml N/A  Left RectFemoris Nml Nml Nml Nml Nml Nml Nml Nml N/A  Left GluteusMed Nml Nml Nml Nml Nml Nml Nml Nml N/A      Waveforms:

## 2024-05-22 ENCOUNTER — Ambulatory Visit: Payer: Self-pay | Admitting: Neurology

## 2024-05-22 NOTE — Progress Notes (Signed)
 MRI lumbar spine 02/27/2024: L3-4: Mild retrolisthesis of L3 on L4, moderate bulging disc osteophyte complex asymmetric left, moderate-severe disc height loss, right lateral Modic type I endplate changes.  Mild left neural foraminal narrowing without spinal canal stenosis. L4-5: Mild disc bulging disc asymmetric to the right, disc desiccation, very small right lateral inferior endplate of L4 cystic Schmorl nodes, mild left facet arthrosis, without narrowing.

## 2024-06-25 LAB — HM MAMMOGRAPHY

## 2024-09-11 ENCOUNTER — Encounter: Payer: Self-pay | Admitting: Family Medicine

## 2024-09-11 ENCOUNTER — Ambulatory Visit: Payer: BC Managed Care – PPO | Admitting: Family Medicine

## 2024-09-11 ENCOUNTER — Ambulatory Visit: Payer: Self-pay | Admitting: Family Medicine

## 2024-09-11 VITALS — BP 108/68 | HR 80 | Temp 97.6°F | Ht 69.0 in | Wt 161.2 lb

## 2024-09-11 DIAGNOSIS — Z789 Other specified health status: Secondary | ICD-10-CM | POA: Diagnosis not present

## 2024-09-11 DIAGNOSIS — E559 Vitamin D deficiency, unspecified: Secondary | ICD-10-CM

## 2024-09-11 DIAGNOSIS — E538 Deficiency of other specified B group vitamins: Secondary | ICD-10-CM

## 2024-09-11 DIAGNOSIS — E7800 Pure hypercholesterolemia, unspecified: Secondary | ICD-10-CM

## 2024-09-11 DIAGNOSIS — E010 Iodine-deficiency related diffuse (endemic) goiter: Secondary | ICD-10-CM | POA: Diagnosis not present

## 2024-09-11 DIAGNOSIS — Z0000 Encounter for general adult medical examination without abnormal findings: Secondary | ICD-10-CM

## 2024-09-11 DIAGNOSIS — H9193 Unspecified hearing loss, bilateral: Secondary | ICD-10-CM

## 2024-09-11 DIAGNOSIS — E041 Nontoxic single thyroid nodule: Secondary | ICD-10-CM

## 2024-09-11 DIAGNOSIS — Z8349 Family history of other endocrine, nutritional and metabolic diseases: Secondary | ICD-10-CM

## 2024-09-11 LAB — CBC WITH DIFFERENTIAL/PLATELET
Basophils Absolute: 0 10*3/uL (ref 0.0–0.1)
Basophils Relative: 1 % (ref 0.0–3.0)
Eosinophils Absolute: 0.2 10*3/uL (ref 0.0–0.7)
Eosinophils Relative: 3.3 % (ref 0.0–5.0)
HCT: 45.5 % (ref 36.0–46.0)
Hemoglobin: 15.4 g/dL — ABNORMAL HIGH (ref 12.0–15.0)
Lymphocytes Relative: 31.5 % (ref 12.0–46.0)
Lymphs Abs: 1.6 10*3/uL (ref 0.7–4.0)
MCHC: 33.8 g/dL (ref 30.0–36.0)
MCV: 91.4 fl (ref 78.0–100.0)
Monocytes Absolute: 0.4 10*3/uL (ref 0.1–1.0)
Monocytes Relative: 8.4 % (ref 3.0–12.0)
Neutro Abs: 2.8 10*3/uL (ref 1.4–7.7)
Neutrophils Relative %: 55.8 % (ref 43.0–77.0)
Platelets: 348 10*3/uL (ref 150.0–400.0)
RBC: 4.98 Mil/uL (ref 3.87–5.11)
RDW: 13.3 % (ref 11.5–15.5)
WBC: 5 10*3/uL (ref 4.0–10.5)

## 2024-09-11 LAB — LIPID PANEL
Cholesterol: 302 mg/dL — ABNORMAL HIGH (ref 28–200)
HDL: 48.8 mg/dL
LDL Cholesterol: 213 mg/dL — ABNORMAL HIGH (ref 10–99)
NonHDL: 253.44
Total CHOL/HDL Ratio: 6
Triglycerides: 202 mg/dL — ABNORMAL HIGH (ref 10.0–149.0)
VLDL: 40.4 mg/dL — ABNORMAL HIGH (ref 0.0–40.0)

## 2024-09-11 LAB — COMPREHENSIVE METABOLIC PANEL WITH GFR
ALT: 17 U/L (ref 3–35)
AST: 19 U/L (ref 5–37)
Albumin: 4.3 g/dL (ref 3.5–5.2)
Alkaline Phosphatase: 48 U/L (ref 39–117)
BUN: 16 mg/dL (ref 6–23)
CO2: 29 meq/L (ref 19–32)
Calcium: 9.7 mg/dL (ref 8.4–10.5)
Chloride: 101 meq/L (ref 96–112)
Creatinine, Ser: 1 mg/dL (ref 0.40–1.20)
GFR: 62.82 mL/min
Glucose, Bld: 90 mg/dL (ref 70–99)
Potassium: 3.9 meq/L (ref 3.5–5.1)
Sodium: 137 meq/L (ref 135–145)
Total Bilirubin: 1.2 mg/dL (ref 0.2–1.2)
Total Protein: 7.5 g/dL (ref 6.0–8.3)

## 2024-09-11 LAB — VITAMIN B12: Vitamin B-12: 871 pg/mL (ref 211–911)

## 2024-09-11 LAB — TSH: TSH: 1.31 u[IU]/mL (ref 0.35–5.50)

## 2024-09-11 LAB — VITAMIN D 25 HYDROXY (VIT D DEFICIENCY, FRACTURES): VITD: 45.76 ng/mL (ref 30.00–100.00)

## 2024-09-11 NOTE — Progress Notes (Signed)
 " Phone [phone]   Subjective:  Patient presents today for their annual physical. Chief complaint-noted.   See problem oriented charting- ROS- full  review of systems was completed and negative except for topics noted under acute/chronic concerns   The following were reviewed and entered/updated in epic: Past Medical History:  Diagnosis Date   Allergy    Coronary artery calcification    CT 07/2019: Ca score 3   Diverticulosis    hx of diverticulitis    Echocardiogram 12/2020   Echo 5/22: EF 60-65, no RWMA, GLS -21.0%, normal RVSF, mild MR, no pericardial effusion   GERD (gastroesophageal reflux disease)    History of chickenpox    Hyperlipidemia    Wears eyeglasses    dry eyes and cornea change    Patient Active Problem List   Diagnosis Date Noted   Bilateral occipital neuralgia 01/18/2021    Priority: High   Dry eye syndrome of both eyes 01/18/2021    Priority: High   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 01/18/2021    Priority: High   Postviral syndrome 10/13/2020    Priority: High   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 10/13/2020    Priority: High   S/P complete hysterectomy 05/17/2022    Priority: Medium    Tinnitus aurium, bilateral 01/18/2021    Priority: Medium    Chronic tension-type headache, intractable 10/13/2020    Priority: Medium    Occipital headache 10/13/2020    Priority: Medium    Fatty liver 09/19/2020    Priority: Medium    Pure hypercholesterolemia 07/07/2019    Priority: Medium    Postsurgical menopause 08/13/2009    Priority: Medium    Allergic rhinitis 11/03/2008    Priority: Medium    Photophobia of both eyes 01/18/2021    Priority: Low   Orthostatic hypertension 10/13/2020    Priority: Low   Dysphonia 10/13/2020    Priority: Low   Diverticular disease of colon 09/19/2020    Priority: Low   Cavernous hemangioma of liver 08/21/2019    Priority: Low   Thyroid  nodule 08/21/2019    Priority:  Low   Herpes simplex 07/07/2019    Priority: Low   GERD (gastroesophageal reflux disease) 07/07/2019    Priority: Low   Overactive bladder 07/07/2019    Priority: Low   Situational depression 11/03/2008    Priority: Low   Vitamin D  deficiency 03/08/2023   Right cervical radiculopathy 08/22/2021   Cervico-occipital neuralgia of the right side 08/22/2021   Past Surgical History:  Procedure Laterality Date   ABDOMINAL HYSTERECTOMY     BLADDER SUSPENSION     CHOLECYSTECTOMY     LAPAROSCOPIC ENDOMETRIOSIS FULGURATION     3 separate surgeries    Family History  Problem Relation Age of Onset   Dementia Mother        portion vascular, apoe positive as well, b12 also low   Hyperlipidemia Mother    Hypertension Mother    Glaucoma Mother    Diabetes Father    Hypertension Father    Cancer Father        Melanoma   Dementia Father        ? had been on aricept so sounds like diagnosis at some point   Lymphoma Father        MALT- was told not hereditary   Colon polyps Sister    Cancer Sister        Skin   Parkinson's disease Maternal Grandfather  Other Son        PANDAS    Medications- reviewed and updated Current Outpatient Medications  Medication Sig Dispense Refill   albuterol  (VENTOLIN  HFA) 108 (90 Base) MCG/ACT inhaler Inhale 1-2 puffs into the lungs every 6 (six) hours as needed. 8 g 0   dicyclomine  (BENTYL ) 20 MG tablet TAKE 1 TABLET (20 MG TOTAL) BY MOUTH 3 (THREE) TIMES DAILY AS NEEDED FOR SPASMS AND DIARRHEA 270 tablet 1   estradiol (ESTRACE) 1 MG tablet Take 1 mg by mouth daily.     fluticasone (FLONASE ALLERGY RELIEF) 50 MCG/ACT nasal spray Place 1 spray into both nostrils as needed for allergies or rhinitis.     loratadine (CLARITIN) 10 MG tablet Take 10 mg by mouth daily as needed for allergies, rhinitis or itching.     methocarbamol (ROBAXIN) 500 MG tablet Take 1 tablet twice a day by oral route.     Omega 3 1000 MG CAPS Take 1,000 mg by mouth daily.      oseltamivir  (TAMIFLU ) 75 MG capsule Take 75 mg by mouth 2 (two) times daily.     testosterone cypionate (DEPOTESTOSTERONE CYPIONATE) 200 MG/ML injection Inject 200 mg into the muscle every 28 (twenty-eight) days.     valACYclovir  (VALTREX ) 1000 MG tablet Take 1,000 mg by mouth as needed (for breakouts).     No current facility-administered medications for this visit.    Allergies-reviewed and updated Allergies[1]  Social History   Social History Narrative   Lives at home with spouse. [name] 21 and Debby 17  at home in 2022.       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 Right Handed   Are you currently employed ? 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 No    Who lives with you? Husband    What type of home do you live in: 1 story or 2 story? #3 level home. Patient rarely goes into the basement.        Objective  Objective:  BP 108/68 (BP Location: Left Arm, Patient Position: Sitting, Cuff Size: Normal)   Pulse 80   Temp 97.6 F (36.4 C) (Temporal)   Ht 5' 9 (1.753 m)   Wt 161 lb 3.2 oz (73.1 kg)   SpO2 99%   BMI 23.81 kg/m  Gen: NAD, resting comfortably HEENT: Mucous membranes are moist. Oropharynx normal Neck: no thyromegaly CV: RRR no murmurs rubs or gallops Lungs: CTAB no crackles, wheeze, rhonchi Abdomen: soft/nontender/nondistended/normal bowel sounds. No rebound or guarding.  Ext: no edema Skin: warm, dry Neuro: grossly normal, moves all extremities, PERRLA   Assessment and Plan   59 y.o. female presenting for annual physical.  Health Maintenance counseling: 1. Anticipatory guidance: Patient counseled regarding regular dental exams -q6 months, eye exams -at least yearly due to dry eyes if not more than that,   avoiding smoking and second hand smoke , limiting alcohol to 1 beverage per day-perhaps 1 a month , no illicit drugs .   2. Risk factor reduction:  Advised patient of need for regular exercise and diet rich and fruits and vegetables to reduce risk of heart attack and  stroke.  Exercise-last physical with Pilates and some walking- doing some walks mainly now but may add some strength training.  Diet/weight management--last physical weight 166-currently down to 161 after prior gain- reports on phentermine through gynecology- did short term but had palpitations and opted off. In 2021 lost 40 lbs with optivia - this time did essential splus protein which has helped with brain fog from long COVID. Also doing coaching through them!  Wt Readings from Last 3 Encounters:  09/11/24 161 lb 3.2 oz (73.1 kg)  03/24/24 177 lb (80.3 kg)  12/31/23 173 lb 6.4 oz (78.7 kg)  3. Immunizations/screenings/ancillary studies-offered Prevnar 20- opts out for now , recommended Tdap-opts out, opts out of Shingrix.  Opts out of flu shot and COVID shot  Immunization History  Administered Date(s) Administered   Influenza Split 07/19/2007, 08/01/2009, 07/25/2011, 06/05/2012   Influenza,inj,quad, With Preservative 08/09/2014, 08/10/2015   Influenza-Unspecified 05/13/2014, 05/16/2015   PFIZER(Purple Top)SARS-COV-2 Vaccination 11/05/2019, 11/26/2019  4. Cervical cancer screening- s/p total hysterectomy for benign reasons  5. Breast cancer screening-  breast exam with GYN and mammogram 06/25/2024 6. Colon cancer screening - Colonoscopy 2024 with Dr. Kristie with 5-year repeat  due to tubular adenoma  7. Skin cancer screening-   Dr. Jordan [facility] derm yearly. advised regular sunscreen use. Denies worrisome, changing, or new skin lesions.  8. Birth control/STD check- postmenopausal and monogamous/plus hysterectomy   9. Osteoporosis screening at 28- normal bone density 2022  -Never smoker  Status of chronic or acute concerns   # Family  history of dementia-mother recently diagnosed and followed previously diagnosed -not interested in apoe testing even though mom positive - We discussed preventative strategies  -wants TSH and B12- relative B12 lows in past  # Bilateral tinnitus-feels like this is progressing with time since covid-reports some hearing loss  -some swooshing but not along with heart beat- no pulsation -had seen [facility] in past- they recommended sound therapy at that time  #Thyroid  nodule? From 04/20/2023-1. Query nodule labeled 1, possible 2.0 cm TR 3 nodule versus pseudonodule. Follow-up ultrasound in 1 year is recommended. -update thyroid  ultrasound  #family history hashimotos thyroiditis - check TSH and anti-TPO  #hyperlipidemia with LDL over 200 at times-Dr. Delford 06/27/2022-Calcium  score only 3 isolated to Lad but high for age as most people age 32 have scores of 0 S: Medication:Cardiology has asked her to consider Zetia versus PCSK9 inhibitors -Patient opts out of statin due to concern of side effects Lab Results  Component Value Date   CHOL 259 (H) 09/11/2023   HDL 44.10 09/11/2023   LDLCALC 174 (H) 09/11/2023   LDLDIRECT 207.0 03/08/2023   TRIG 203.0 (H) 09/11/2023   CHOLHDL 6 09/11/2023   A/P: update lipids- cardiology has been interested in potential alternative treatment(s) with side effects concern on statins  #Vitamin D  deficiency S: Medication:  takes OTC (available over the counter without a prescription) through just [employer]- 5000 units and K2 Last vitamin D  Lab Results  Component Value Date   VD25OH 33.51 09/11/2023  A/P: hopefully stable- update vitamin D   today. Continue current meds for now    #hepatic steatosis - 09/20/22 abdominal ultrasound. Mild LFT elevations in past - #s looked better last time so will trend- if elevate again could reconsider imaging Lab Results  Component Value Date   ALT 13 09/11/2023   AST 17 09/11/2023   ALKPHOS 52 09/11/2023   BILITOT 0.9 09/11/2023    # IBS like symptoms-has Bentyl /dicyclomine  available- intermittent still   #  reduced libido- doing testosterone injections with Dr. Rox -She is also on estradiol  # Bilateral feet and lower extremity paresthesias-nerve conduction study/EMG reassuring with Dr. Tobie of [facility] August 2025.  Prior MRI lumbar spine -Had been referred to neurology by Dr. Arnaldo orthopedic - good pulses and reassuring ABIs -B12 and TSH have been normal - started h[employer] wide shoes through [employer] and much improved!   Recommended follow up: Return in about 1 year (around 09/11/2025) for physical or sooner if needed.Schedule b4 you leave.  Lab/Order associations: fasting   ICD-10-CM   1. Preventative health care  Z00.00     2. Thyromegaly  E01.0     3. Pure hypercholesterolemia  E78.00     4. Family history of Hashimoto thyroiditis  Z83.49     5. Thyroid  nodule  E04.1     6. Vitamin D  deficiency  E55.9     7. B12 deficiency  E53.8     8. Bilateral hearing loss, unspecified hearing loss type  H91.93 Ambulatory referral to Audiology      No orders of the defined types were placed in this encounter.   Return precautions advised.  Garnette Lukes, MD      [1]  Allergies Allergen Reactions   Codeine Nausea And Vomiting   Iodine    Penicillins     REACTION: hives   Sulfonamide Derivatives     REACTION: rash   Ciprofloxacin Dermatitis, Hives, Nausea And Vomiting and Nausea Only    ciprofloxacin   Doxycycline Itching and Rash   "

## 2024-09-11 NOTE — Patient Instructions (Addendum)
 Tetanus, Diphtheria, and Pertussis (Tdap) due for- good for 10 years  We have placed a referral for you today to audiology- please call their # if you do not hear within a week (may be listed below or you may see mychart message within a few days with #).   We will call you within two weeks about your referral for thyroid  ultrasound through [facility] [place].  Their phone number is [phone].  Please call them if you have not heard in 1-2 weeks  Please stop by lab before you go If you have mychart- we will send your results within 3 business days of us  receiving them.  If you do not have mychart- we will call you about results within 5 business days of us  receiving them.  *please also note that you will see labs on mychart as soon as they post. I will later go in and write notes on them- will say notes from Dr. Katrinka   Recommended follow up: Return in about 1 year (around 09/11/2025) for physical or sooner if needed.Schedule b4 you leave.

## 2024-09-17 ENCOUNTER — Inpatient Hospital Stay: Admission: RE | Admit: 2024-09-17 | Discharge: 2024-09-17 | Attending: Family Medicine | Admitting: Family Medicine

## 2024-09-17 DIAGNOSIS — E041 Nontoxic single thyroid nodule: Secondary | ICD-10-CM

## 2024-09-17 DIAGNOSIS — E010 Iodine-deficiency related diffuse (endemic) goiter: Secondary | ICD-10-CM

## 2024-09-18 LAB — ANTI-TPO AB (RDL): Anti-TPO Ab (RDL): <9 [IU]/mL

## 2024-09-29 ENCOUNTER — Ambulatory Visit: Admitting: Audiologist

## 2025-09-13 ENCOUNTER — Encounter: Admitting: Family Medicine
